# Patient Record
Sex: Male | Born: 1950 | Race: White | Hispanic: No | Marital: Married | Smoking: Current every day smoker
Health system: Southern US, Community
[De-identification: ages and names within clinical notes are randomized; demographics above are authoritative.]

## PROBLEM LIST (undated history)

## (undated) DIAGNOSIS — K279 Peptic ulcer, site unspecified, unspecified as acute or chronic, without hemorrhage or perforation: Secondary | ICD-10-CM

## (undated) DIAGNOSIS — G4730 Sleep apnea, unspecified: Secondary | ICD-10-CM

## (undated) DIAGNOSIS — J449 Chronic obstructive pulmonary disease, unspecified: Secondary | ICD-10-CM

## (undated) DIAGNOSIS — K219 Gastro-esophageal reflux disease without esophagitis: Secondary | ICD-10-CM

## (undated) DIAGNOSIS — N400 Enlarged prostate without lower urinary tract symptoms: Secondary | ICD-10-CM

## (undated) DIAGNOSIS — F419 Anxiety disorder, unspecified: Secondary | ICD-10-CM

## (undated) DIAGNOSIS — C801 Malignant (primary) neoplasm, unspecified: Secondary | ICD-10-CM

## (undated) DIAGNOSIS — E119 Type 2 diabetes mellitus without complications: Secondary | ICD-10-CM

## (undated) DIAGNOSIS — N189 Chronic kidney disease, unspecified: Secondary | ICD-10-CM

## (undated) DIAGNOSIS — K759 Inflammatory liver disease, unspecified: Secondary | ICD-10-CM

## (undated) DIAGNOSIS — M1990 Unspecified osteoarthritis, unspecified site: Secondary | ICD-10-CM

## (undated) DIAGNOSIS — I10 Essential (primary) hypertension: Secondary | ICD-10-CM

## (undated) HISTORY — PX: EYE SURGERY: SHX253

## (undated) HISTORY — PX: APPENDECTOMY: SHX54

## (undated) HISTORY — PX: ANTERIOR CERVICAL DECOMP/DISCECTOMY FUSION: SHX1161

## (undated) HISTORY — PX: CHOLECYSTECTOMY: SHX55

## (undated) HISTORY — PX: BACK SURGERY: SHX140

## (undated) HISTORY — PX: TONSILLECTOMY: SUR1361

---

## 2014-01-17 ENCOUNTER — Telehealth (HOSPITAL_COMMUNITY): Payer: Self-pay | Admitting: *Deleted

## 2014-01-24 ENCOUNTER — Telehealth (HOSPITAL_COMMUNITY): Payer: Self-pay | Admitting: *Deleted

## 2014-02-02 ENCOUNTER — Ambulatory Visit: Payer: Self-pay | Admitting: Cardiovascular Disease

## 2014-02-28 ENCOUNTER — Ambulatory Visit: Payer: Self-pay | Admitting: Cardiovascular Disease

## 2015-11-19 DIAGNOSIS — G4733 Obstructive sleep apnea (adult) (pediatric): Secondary | ICD-10-CM | POA: Diagnosis not present

## 2015-11-19 DIAGNOSIS — J449 Chronic obstructive pulmonary disease, unspecified: Secondary | ICD-10-CM | POA: Diagnosis not present

## 2015-11-19 DIAGNOSIS — E1122 Type 2 diabetes mellitus with diabetic chronic kidney disease: Secondary | ICD-10-CM | POA: Diagnosis not present

## 2015-11-19 DIAGNOSIS — M170 Bilateral primary osteoarthritis of knee: Secondary | ICD-10-CM | POA: Diagnosis not present

## 2015-11-19 DIAGNOSIS — E785 Hyperlipidemia, unspecified: Secondary | ICD-10-CM | POA: Diagnosis not present

## 2015-11-19 DIAGNOSIS — K219 Gastro-esophageal reflux disease without esophagitis: Secondary | ICD-10-CM | POA: Diagnosis not present

## 2015-11-19 DIAGNOSIS — F330 Major depressive disorder, recurrent, mild: Secondary | ICD-10-CM | POA: Diagnosis not present

## 2015-11-19 DIAGNOSIS — F17200 Nicotine dependence, unspecified, uncomplicated: Secondary | ICD-10-CM | POA: Diagnosis not present

## 2015-11-19 DIAGNOSIS — I129 Hypertensive chronic kidney disease with stage 1 through stage 4 chronic kidney disease, or unspecified chronic kidney disease: Secondary | ICD-10-CM | POA: Diagnosis not present

## 2018-01-08 DIAGNOSIS — S82401A Unspecified fracture of shaft of right fibula, initial encounter for closed fracture: Secondary | ICD-10-CM | POA: Diagnosis not present

## 2018-01-26 DIAGNOSIS — M19049 Primary osteoarthritis, unspecified hand: Secondary | ICD-10-CM | POA: Diagnosis not present

## 2018-01-26 DIAGNOSIS — M479 Spondylosis, unspecified: Secondary | ICD-10-CM | POA: Diagnosis not present

## 2018-01-26 DIAGNOSIS — K219 Gastro-esophageal reflux disease without esophagitis: Secondary | ICD-10-CM | POA: Diagnosis not present

## 2018-01-26 DIAGNOSIS — E785 Hyperlipidemia, unspecified: Secondary | ICD-10-CM | POA: Diagnosis not present

## 2018-01-26 DIAGNOSIS — F3340 Major depressive disorder, recurrent, in remission, unspecified: Secondary | ICD-10-CM | POA: Diagnosis not present

## 2018-01-26 DIAGNOSIS — E663 Overweight: Secondary | ICD-10-CM | POA: Diagnosis not present

## 2018-01-26 DIAGNOSIS — E119 Type 2 diabetes mellitus without complications: Secondary | ICD-10-CM | POA: Diagnosis not present

## 2018-01-26 DIAGNOSIS — I10 Essential (primary) hypertension: Secondary | ICD-10-CM | POA: Diagnosis not present

## 2018-01-26 DIAGNOSIS — F17200 Nicotine dependence, unspecified, uncomplicated: Secondary | ICD-10-CM | POA: Diagnosis not present

## 2019-08-12 ENCOUNTER — Other Ambulatory Visit (HOSPITAL_COMMUNITY): Payer: Self-pay | Admitting: Neurology

## 2019-08-12 DIAGNOSIS — R251 Tremor, unspecified: Secondary | ICD-10-CM

## 2019-08-12 DIAGNOSIS — G20 Parkinson's disease: Secondary | ICD-10-CM

## 2019-09-01 ENCOUNTER — Encounter (HOSPITAL_COMMUNITY)
Admission: RE | Admit: 2019-09-01 | Discharge: 2019-09-01 | Disposition: A | Discharge status: Home / Self Care | Payer: No Typology Code available for payment source | Source: Ambulatory Visit | Attending: Neurology | Admitting: Neurology

## 2019-09-01 ENCOUNTER — Ambulatory Visit (HOSPITAL_COMMUNITY)
Admission: RE | Admit: 2019-09-01 | Discharge: 2019-09-01 | Disposition: A | Discharge status: Home / Self Care | Payer: No Typology Code available for payment source | Source: Ambulatory Visit | Attending: Neurology | Admitting: Neurology

## 2019-09-01 ENCOUNTER — Other Ambulatory Visit: Payer: Self-pay

## 2019-09-01 DIAGNOSIS — G20 Parkinson's disease: Secondary | ICD-10-CM | POA: Insufficient documentation

## 2019-09-01 DIAGNOSIS — R251 Tremor, unspecified: Secondary | ICD-10-CM | POA: Diagnosis present

## 2019-09-01 MED ORDER — IODINE STRONG (LUGOLS) 5 % PO SOLN
0.8000 mL | Freq: Once | ORAL | Status: AC
  Administered 2019-09-01: 0.8 mL via ORAL

## 2019-09-01 MED ORDER — IODINE STRONG (LUGOLS) 5 % PO SOLN
ORAL | Status: AC
  Filled 2019-09-01: qty 1

## 2019-09-01 MED ORDER — IOFLUPANE I 123 185 MBQ/2.5ML IV SOLN
4.7000 | Freq: Once | INTRAVENOUS | Status: AC | PRN
  Administered 2019-09-01: 4.7 via INTRAVENOUS
  Filled 2019-09-01: qty 5

## 2020-08-29 DIAGNOSIS — J449 Chronic obstructive pulmonary disease, unspecified: Secondary | ICD-10-CM

## 2021-01-21 ENCOUNTER — Other Ambulatory Visit: Payer: Self-pay | Admitting: Neurosurgery

## 2021-01-22 ENCOUNTER — Other Ambulatory Visit: Payer: Self-pay | Admitting: Neurosurgery

## 2021-01-28 NOTE — Progress Notes (Signed)
Surgical Instructions    Your procedure is scheduled on Friday, September 23rd, 2022.   Report to [facility] Main Entrance "A" at 09:45 A.M., then check in with the Admitting office.  Call this number if you have problems the morning of surgery:  [phone]   If you have any questions prior to your surgery date call [phone]: Open Monday-Friday 8am-4pm    Remember:  Do not eat or drink after midnight the night before your surgery     Take these medicines the morning of surgery with A SIP OF WATER:  busPIRone (BUSPAR)  cetirizine (ZYRTEC) DULoxetine (CYMBALTA)  Oxcarbazepine (TRILEPTAL)  pantoprazole (PROTONIX) pravastatin (PRAVACHOL) ranolazine (RANEXA)   If needed:  albuterol (VENTOLIN HFA) - please, bring the inhaler with you the day of surgery cyclobenzaprine (FLEXERIL)   Follow your surgeon's instructions on when to stop Aspirin.  If no instructions were given by your surgeon then you will need to call the office to get those instructions.     As of today, STOP taking any Aspirin (unless otherwise instructed by your surgeon) Aleve, Naproxen, Ibuprofen, Motrin, Advil, Goody's, BC's, all herbal medications, fish oil, and all vitamins.   WHAT DO I DO ABOUT MY DIABETES MEDICATION?  THE NIGHT BEFORE SURGERY do not take insulin regular (NOVOLIN R) at bedtime      THE MORNING OF SURGERY, do not take insulin regular (NOVOLIN R)  If your CBG is greater than 220 mg/dL, you may take  of your sliding scale (correction) dose of insulin.   HOW TO MANAGE YOUR DIABETES BEFORE AND AFTER SURGERY  Why is it important to control my blood sugar before and after surgery? Improving blood sugar levels before and after surgery helps healing and can limit problems. A way of improving blood sugar control is eating a healthy diet by:  Eating less sugar and carbohydrates  Increasing activity/exercise  Talking with your doctor about reaching your blood sugar goals High blood  sugars (greater than 180 mg/dL) can raise your risk of infections and slow your recovery, so you will need to focus on controlling your diabetes during the weeks before surgery. Make sure that the doctor who takes care of your diabetes knows about your planned surgery including the date and location.  How do I manage my blood sugar before surgery? Check your blood sugar at least 4 times a day, starting 2 days before surgery, to make sure that the level is not too high or low.  Check your blood sugar the morning of your surgery when you wake up and every 2 hours until you get to the Short Stay unit.  If your blood sugar is less than 70 mg/dL, you will need to treat for low blood sugar: Do not take insulin. Treat a low blood sugar (less than 70 mg/dL) with  cup of clear juice (cranberry or apple), 4 glucose tablets, OR glucose gel. Recheck blood sugar in 15 minutes after treatment (to make sure it is greater than 70 mg/dL). If your blood sugar is not greater than 70 mg/dL on recheck, call [phone] for further instructions. Report your blood sugar to the short stay nurse when you get to Short Stay.  If you are admitted to the hospital after surgery: Your blood sugar will be checked by the staff and you will probably be given insulin after surgery (instead of oral diabetes medicines) to make sure you have good blood sugar levels. The goal for blood sugar control after surgery is 80-180 mg/dL.  Do not wear jewelry  Do not wear lotions, powders, colognes, or deodorant. Men may shave face and neck. Do not bring valuables to the hospital.              [facility] is not responsible for any belongings or valuables.  Do NOT Smoke (Tobacco/Vaping)  24 hours prior to your procedure If you use a CPAP at night, you may bring your mask for your overnight stay.   Contacts, glasses, dentures or bridgework may not be worn into surgery, please bring cases for these belongings   For  patients admitted to the hospital, discharge time will be determined by your treatment team.   Patients discharged the day of surgery will not be allowed to drive home, and someone needs to stay with them for 24 hours.  NO VISITORS WILL BE ALLOWED IN PRE-OP WHERE PATIENTS GET READY FOR SURGERY.  ONLY 1 SUPPORT PERSON MAY BE PRESENT WHILE YOU ARE IN SURGERY.  IF YOU ARE TO BE ADMITTED, ONCE YOU ARE IN YOUR ROOM YOU WILL BE ALLOWED TWO (2) VISITORS.  Minor children may have two parents present. Special consideration for safety and communication needs will be reviewed on a case by case basis.  Special instructions:    Oral Hygiene is also important to reduce your risk of infection.  Remember - BRUSH YOUR TEETH THE MORNING OF SURGERY WITH YOUR REGULAR TOOTHPASTE   [place]- Preparing For Surgery  Before surgery, you can play an important role. Because skin is not sterile, your skin needs to be as free of germs as possible. You can reduce the number of germs on your skin by washing with CHG (chlorahexidine gluconate) Soap before surgery.  CHG is an antiseptic cleaner which kills germs and bonds with the skin to continue killing germs even after washing.     Please do not use if you have an allergy to CHG or antibacterial soaps. If your skin becomes reddened/irritated stop using the CHG.  Do not shave (including legs and underarms) for at least 48 hours prior to first CHG shower. It is OK to shave your face.  Please follow these instructions carefully.     Shower the NIGHT BEFORE SURGERY and the MORNING OF SURGERY with CHG Soap.   If you chose to wash your hair, wash your hair first as usual with your normal shampoo. After you shampoo, rinse your hair and body thoroughly to remove the shampoo.  Then [employer] and genitals (private parts) with your normal soap and rinse thoroughly to remove soap.  After that Use CHG Soap as you would any other liquid soap. You can apply CHG directly to the skin  and wash gently with a scrungie or a clean washcloth.   Apply the CHG Soap to your body ONLY FROM THE NECK DOWN.  Do not use on open wounds or open sores. Avoid contact with your eyes, ears, mouth and genitals (private parts). Wash Face and genitals (private parts)  with your normal soap.   Wash thoroughly, paying special attention to the area where your surgery will be performed.  Thoroughly rinse your body with warm water from the neck down.  DO NOT shower/wash with your normal soap after using and rinsing off the CHG Soap.  Pat yourself dry with a CLEAN TOWEL.  Wear CLEAN PAJAMAS to bed the night before surgery  Place CLEAN SHEETS on your bed the night before your surgery  DO NOT SLEEP WITH PETS.   Day of  Surgery:  Take a shower with CHG soap. Wear Clean/Comfortable clothing the morning of surgery Do not apply any deodorants/lotions.   Remember to brush your teeth WITH YOUR REGULAR TOOTHPASTE.   Please read over the following fact sheets that you were given.

## 2021-01-29 ENCOUNTER — Encounter (HOSPITAL_COMMUNITY)
Admission: RE | Admit: 2021-01-29 | Discharge: 2021-01-29 | Disposition: A | Discharge status: Home / Self Care | Payer: No Typology Code available for payment source | Source: Ambulatory Visit | Attending: Neurosurgery | Admitting: Neurosurgery

## 2021-01-29 ENCOUNTER — Other Ambulatory Visit: Payer: Self-pay

## 2021-01-29 ENCOUNTER — Encounter (HOSPITAL_COMMUNITY): Payer: Self-pay

## 2021-01-29 DIAGNOSIS — Z01812 Encounter for preprocedural laboratory examination: Secondary | ICD-10-CM | POA: Insufficient documentation

## 2021-01-29 DIAGNOSIS — Z20822 Contact with and (suspected) exposure to covid-19: Secondary | ICD-10-CM | POA: Insufficient documentation

## 2021-01-29 HISTORY — DX: Peptic ulcer, site unspecified, unspecified as acute or chronic, without hemorrhage or perforation: K27.9

## 2021-01-29 HISTORY — DX: Essential (primary) hypertension: I10

## 2021-01-29 HISTORY — DX: Chronic obstructive pulmonary disease, unspecified: J44.9

## 2021-01-29 HISTORY — DX: Inflammatory liver disease, unspecified: K75.9

## 2021-01-29 HISTORY — DX: Chronic kidney disease, unspecified: N18.9

## 2021-01-29 HISTORY — DX: Anxiety disorder, unspecified: F41.9

## 2021-01-29 HISTORY — DX: Unspecified osteoarthritis, unspecified site: M19.90

## 2021-01-29 HISTORY — DX: Type 2 diabetes mellitus without complications: E11.9

## 2021-01-29 HISTORY — DX: Gastro-esophageal reflux disease without esophagitis: K21.9

## 2021-01-29 HISTORY — DX: Sleep apnea, unspecified: G47.30

## 2021-01-29 HISTORY — DX: Benign prostatic hyperplasia without lower urinary tract symptoms: N40.0

## 2021-01-29 HISTORY — DX: Malignant (primary) neoplasm, unspecified: C80.1

## 2021-01-29 LAB — COMPREHENSIVE METABOLIC PANEL
ALT: 10 U/L (ref 0–44)
AST: 14 U/L — ABNORMAL LOW (ref 15–41)
Albumin: 3.4 g/dL — ABNORMAL LOW (ref 3.5–5.0)
Alkaline Phosphatase: 111 U/L (ref 38–126)
Anion gap: 11 (ref 5–15)
BUN: 18 mg/dL (ref 8–23)
CO2: 25 mmol/L (ref 22–32)
Calcium: 9.3 mg/dL (ref 8.9–10.3)
Chloride: 95 mmol/L — ABNORMAL LOW (ref 98–111)
Creatinine, Ser: 1.75 mg/dL — ABNORMAL HIGH (ref 0.61–1.24)
GFR, Estimated: 41 mL/min — ABNORMAL LOW (ref >60)
Glucose, Bld: 154 mg/dL — ABNORMAL HIGH (ref 70–99)
Potassium: 4.4 mmol/L (ref 3.5–5.1)
Sodium: 131 mmol/L — ABNORMAL LOW (ref 135–145)
Total Bilirubin: 0.5 mg/dL (ref 0.3–1.2)
Total Protein: 6.5 g/dL (ref 6.5–8.1)

## 2021-01-29 LAB — CBC
HCT: 33.8 % — ABNORMAL LOW (ref 39.0–52.0)
Hemoglobin: 11 g/dL — ABNORMAL LOW (ref 13.0–17.0)
MCH: 29.1 pg (ref 26.0–34.0)
MCHC: 32.5 g/dL (ref 30.0–36.0)
MCV: 89.4 fL (ref 80.0–100.0)
Platelets: 301 10*3/uL (ref 150–400)
RBC: 3.78 MIL/uL — ABNORMAL LOW (ref 4.22–5.81)
RDW: 13.5 % (ref 11.5–15.5)
WBC: 8.3 10*3/uL (ref 4.0–10.5)
nRBC: 0 % (ref 0.0–0.2)

## 2021-01-29 LAB — SURGICAL PCR SCREEN
MRSA, PCR: NEGATIVE
Staphylococcus aureus: NEGATIVE

## 2021-01-29 LAB — TYPE AND SCREEN
ABO/RH(D): O POS
Antibody Screen: NEGATIVE

## 2021-01-29 LAB — GLUCOSE, CAPILLARY: Glucose-Capillary: 175 mg/dL — ABNORMAL HIGH (ref 70–99)

## 2021-01-29 LAB — SARS CORONAVIRUS 2 (TAT 6-24 HRS): SARS Coronavirus 2: NEGATIVE

## 2021-01-29 NOTE — Progress Notes (Signed)
Abnormal lab in PAT - Creatinine 1.75. Dr Kathyrn Sheriff office was notified (scheduler).

## 2021-01-29 NOTE — Progress Notes (Signed)
PCP - Sharmaine Base, MD ([place]) Cardiologist -   PPM/ICD - denies Device Orders - n/a Rep Notified - n/a  Chest x-ray - n/a EKG - 01/07/2021 ([facility]) - records requested Stress Test - > 10 years ago ECHO - 2019 per patient  Cardiac Cath - > 10 years ago  Sleep Study - yes CPAP - not wearing  Fasting Blood Sugar - 120 -215 Checks Blood Sugar 2 times a day CBG today - 175 A1C - 5.9 on 12/20/2020 ([facility])  Blood Thinner Instructions: n/a  Aspirin Instructions: Aspirin last dose - 02/03/2021 per patient  Patient was instructed: As of today, STOP taking any Aspirin (unless otherwise instructed by your surgeon) Aleve, Naproxen, Ibuprofen, Motrin, Advil, Goody's, BC's, all herbal medications, fish oil, and all vitamins.    ERAS Protcol - no  COVID TEST- 01/29/2021 in PAT  Anesthesia review: yes - records requested  Patient denies shortness of breath, fever, cough and chest pain at PAT appointment   All instructions explained to the patient, with a verbal understanding of the material. Patient agrees to go over the instructions while at home for a better understanding. Patient also instructed to self quarantine after being tested for COVID-19. The opportunity to ask questions was provided.

## 2021-01-30 NOTE — Progress Notes (Addendum)
Anesthesia Chart Review:  Case: [id number] Date/Time: 02/01/21 1128   Procedure: ACDF C34, C45   Anesthesia type: General   Pre-op diagnosis: CERVICAL DISC DISORDER WITH MYELOPATHY, CERVICAL REGION   Location: [facility] OR ROOM 7 / [facility]   Surgeons: Consuella Lose, MD       DISCUSSION: Patient is a 70 year old male scheduled for the above procedure.  History includes smoking, COPD, HTN, DM2, CKD (Stage 3), OSA (noncompliant with CPAP), fatty liver disease, melanoma (back), anxiety, back surgery, cholecystectomy, appendectomy.   [facility] cardiologist signed a letter of preoperative clearance classifying patient as "Low Risk" with permission to hold ASA for 7 days prior to surgery. It appears signature is from Dr. Gardiner Fanti.   Most [facility] record are currently failing to download through through [facility]. I was able to access some recent lab results from the [facility] that show Cr ~ 1.8-2.1 in July-August 2022. [place] records indicate history of CKD stage 3 and AS by 2007 US--however, no AS seen on 02/2020 echo.   Record request with signed ROI faxed to [facility]. I also left a voice message with [facility] cardiology nurse line (where cardiology clearance note was faxed from) requesting additional records includes last office notes, labs, EKG, and relevant cardiac studies. (He thought last stress test and cardiac cath were > 10 years ago).   Chart will be left for follow-up.  ADDENDUM 01/31/21 11:345 AM: Additional cardiology records received reviewed. Primary cardiologist is Dr. Clementeen Graham at the [facility]. He referred patient to [facility] in late summer 2021 for consider of LHC because patient had reported chest pains and refused a stress test given "bad experience with prior one" (~ 20 years ago). He was seen by cardiologist Mathis Bud, MD on 12/22/19 (see [facility]). Dr. Beatrix Fetters felt patient's symptoms were atypical for cardiac etiology  but noted patient did have multiple risk factors including smoking, DM2, hypercholesterolemia, HTN, and family history (grandmother). Dr. Beatrix Fetters did not feel comfortable having patient proceed with LHC given atypical symptoms and concern for causing renal failure given baseline CKD (Cr then ~ 2.2). He could not convince Mr. Carrero to reconsider a stress test even though he discussed improvements in medications used over the past twenty years. He added, "I did ask him the question since the likelihood is potentially high that he has multivessel coronary disease what if he needed surgery and he stated he would never consider any type of heart surgery.  It appears that one of the patient's main problems is his noncompliance." At that time BP would not allow addition of a b-blocker, but he was started on Imdur 30 mg daily and Nitro SL as needed. Since then, Mr Fauth has had ongoing follow-up with Dr. Clementeen Graham. Per 01/30/21 note, patient could not tolerate Imdur due to headaches and only took it for four days. He was started on Ranexa which was increased 02/2020. Echo then showed normal LVEF with normal wall motion. At 01/30/21 visit, patient reported a "constant swimmy headed feeling but no chest pains." Chronic DOE class III was felt stable on inhalers. No syncope, palpitations, edema, or orthopnea. He notes additional history C6-7 ACDF 07/30/16, PUD (s/p APC 08/2019), BPH, GERD, SBO (s/p exploratory laparotomy), and question of carotid artery occlusion (2007). For assessment/plan he indicates chest pain/dyspnea/weakness have be present for 5+ years with suspicion that he likely has underlying CAD. Continue Ranexa 500 mg BID, metoprolol 12.5 mg daily, ASA 81 mg daily.  EKG was normal, so did not recommend a ZioPatch for "swimmyheaded". Start exercise program recommended with six month follow-up.    Reviewed cardiology notes and preoperative cardiac clearance with anesthesiologist Renold Don, MD. Patient in on Ranexa and low  dose b-blocker for likely underlying CAD, although refuses stress test and not currently felt a candidate for LHC given stable symptoms on medical therapy and underlying CKD. There is concern that he has myelopathic symptoms for his cervical spine disease. Recent EKG showed ST at 109 bpm. Echo 02/2020 showed normal LVEF and wall motion. DM is well controlled. Still smoking. Anesthesia team to evaluate on the day of surgery.    VS: BP (!) 144/90   Pulse 95   Temp 36.7 C (Oral)   Resp 19   Ht 5\' 10"  (1.778 m)   Wt 73.9 kg   SpO2 98%   BMI 23.39 kg/m    PROVIDERS: Sharmaine Base, MD is PCP  ([place]) Gardiner Fanti, MD is cardiologist [facility]   LABS: Labs reviewed: Acceptable for surgery. Creatinine 1.75, but known CKD stage 3. His Creatinine was 2.0 - 2.1 on 01/01/21 and 1.84 on 12/06/20.  (all labs ordered are listed, but only abnormal results are displayed)  Labs Reviewed  GLUCOSE, CAPILLARY - Abnormal; Notable for the following components:      Result Value   Glucose-Capillary 175 (*)    All other components within normal limits  CBC - Abnormal; Notable for the following components:   RBC 3.78 (*)    Hemoglobin 11.0 (*)    HCT 33.8 (*)    All other components within normal limits  COMPREHENSIVE METABOLIC PANEL - Abnormal; Notable for the following components:   Sodium 131 (*)    Chloride 95 (*)    Glucose, Bld 154 (*)    Creatinine, Ser 1.75 (*)    Albumin 3.4 (*)    AST 14 (*)    GFR, Estimated 41 (*)    All other components within normal limits  SURGICAL PCR SCREEN  SARS CORONAVIRUS 2 (TAT 6-24 HRS)  TYPE AND SCREEN   A1c 12/20/20 was 5.9% ([place])   IMAGES: MRI C-spine 01/01/21 ([facility]): IMPRESSION: 1. Stable to slight progression of cervical disc and facet degeneration, worst at C4-5 where there is moderate to severe spinal stenosis and severe bilateral neural foraminal stenosis. Mildly increased cord signal at this  level may reflect edema or myelomalacia. 2. Moderate spinal stenosis at C3-4.  CT abd/pelvis 12/14/20 ([facility]): LOWER CHEST:  - Chest wall: Portions imaged are unremarkable  - Mediastinum/hila: Portions imaged are unremarkable  - Cardiovascular: Trace pericardial effusion/thickening  - Pleura: No pleural effusion  - Lungs: Emphysematous changes of the lung. Right subpleural  pulmonary opacities are better assessed and unchanged since more  recent dedicated chest CT scan of 10/25/2020. No large  consolidation.   ABDOMEN:  - The liver is unremarkable. Cholecystectomy No definitive biliary ductal dilatation. The spleen is normal. The pancreas is unremarkable. Similar nodularity of bilateral adrenal glands.  - No hydronephrosis. No nephrolithiasis. Numerous bilateral  low-attenuation lesions are difficult to confidently  characterized but overall unchanged in size and appearance when  compared to prior nonenhanced examination of 2021. They are  probably most consistent with parapelvic renal sinus and  intraparenchymal cysts. No ureteral calculus. Nonspecific  perinephric stranding.  - No evidence of bowel obstruction. The appendix is no confidently  visualized. Moderate colonic stool burden. Colonic  diverticulosis. No large submucosal  lesion is identified by CT.  Surgical clips are noted along the gastric fundus  - Vascular: Atherosclerotic calcifications involving the abdominal  aorta and its branches.   PELVIS:  - Circumferential bladder wall thickening is nonspecific but  probably related to chronic outlet obstruction and/or cystitis.  Right bladder diverticulum. No pelvic lymphadenopathy. No  retroperitoneal lymphadenopathy.  Prostatomegaly.   MSK:  - Stable sclerotic lesion within the right ischium. Multilevel  degenerative changes of the thoracolumbar spine visualized.  Postsurgical changes of ventral abdominal wall hernia repair.  Impression: 1. No large submucosal  gastric lesion is identified by CT.  2. Circumferential bladder wall thickening is nonspecific but  probably related to chronic outlet obstruction and/or cystitis.  Right bladder diverticulum. Prostatomegaly. Correlate with  patient clinical symptoms and labs.  3. Additional incidental findings as above    [facility] Sinus tachycardia at 109 bpm, otherwise normal   CV: Echo 02/13/20 ([facility]): SUMMARY  The left ventricular size is normal.  Mild left ventricular hypertrophy  Left ventricular systolic function is normal.  LV ejection fraction = 60-65%.  The left ventricular wall motion is normal.  Normal RV size and function.  Normal LA/RA size. AV sclerosis. No aortic stenosis or regurgitation.   Past Medical History:  Diagnosis Date   Anxiety    Arthritis    Cancer ([place])    melanoma on back   COPD (chronic obstructive pulmonary disease) (HCC)    Diabetes mellitus without complication ([place])    Hepatitis    fatty liver   Hypertension    Sleep apnea     Past Surgical History:  Procedure Laterality Date   APPENDECTOMY     BACK SURGERY     CHOLECYSTECTOMY     EYE SURGERY     bilateral cataracts   TONSILLECTOMY      MEDICATIONS:  albuterol (VENTOLIN HFA) 108 (90 Base) MCG/ACT inhaler   aspirin EC 81 MG tablet   busPIRone (BUSPAR) 15 MG tablet   Calcium Carb-Cholecalciferol 600-400 MG-UNIT TABS   cetirizine (ZYRTEC) 10 MG tablet   Cholecalciferol 25 MCG (1000 UT) tablet   cyclobenzaprine (FLEXERIL) 10 MG tablet   DULoxetine (CYMBALTA) 60 MG capsule   famotidine (PEPCID) 20 MG tablet   insulin regular (NOVOLIN R) 100 units/mL injection   lidocaine (LIDODERM) 5 %   linaclotide (LINZESS) 145 MCG CAPS capsule   Magnesium Oxide 420 MG TABS   metoprolol tartrate (LOPRESSOR) 25 MG tablet   Omega-3 Fatty Acids (FISH OIL) 500 MG CAPS   Oxcarbazepine (TRILEPTAL) 300 MG tablet   pantoprazole (PROTONIX) 40 MG tablet   pravastatin (PRAVACHOL) 10 MG tablet    ranolazine (RANEXA) 500 MG 12 hr tablet   rOPINIRole (REQUIP) 0.25 MG tablet   tamsulosin (FLOMAX) 0.4 MG CAPS capsule   No current facility-administered medications for this encounter.    Myra Gianotti, PA-C Surgical Short Stay/Anesthesiology [facility] Phone [phone] [facility] Phone [phone] 01/30/2021 5:00 PM

## 2021-01-31 ENCOUNTER — Encounter (HOSPITAL_COMMUNITY): Payer: Self-pay

## 2021-01-31 NOTE — Anesthesia Preprocedure Evaluation (Addendum)
Anesthesia Evaluation  Patient identified by MRN, date of birth, ID band Patient awake    Reviewed: Allergy & Precautions, Patient's Chart, lab work & pertinent test results  History of Anesthesia Complications Negative for: history of anesthetic complications  Airway Mallampati: I  TM Distance: >3 FB Neck ROM: Full    Dental no notable dental hx. (+) Dental Advisory Given   Pulmonary COPD,  COPD inhaler, Current Smoker,    Pulmonary exam normal        Cardiovascular hypertension, Pt. on home beta blockers  Rhythm:Regular Rate:Tachycardia  Echo 02/13/20 ([facility]): SUMMARY  The left ventricular size is normal.  Mild left ventricular hypertrophy  Left ventricular systolic function is normal.  LV ejection fraction = 60-65%.  The left ventricular wall motion is normal.  Normal RV size and function.  Normal LA/RA size. AV sclerosis. No aortic stenosis or regurgitation   Neuro/Psych negative neurological ROS     GI/Hepatic Neg liver ROS, PUD, GERD  ,  Endo/Other  negative endocrine ROSdiabetes  Renal/GU Renal InsufficiencyRenal disease     Musculoskeletal negative musculoskeletal ROS (+)   Abdominal   Peds  Hematology negative hematology ROS (+)   Anesthesia Other Findings   Reproductive/Obstetrics                           Anesthesia Physical Anesthesia Plan  ASA: 3  Anesthesia Plan: General   Post-op Pain Management:    Induction: Intravenous  PONV Risk Score and Plan: 2 and Ondansetron and Dexamethasone  Airway Management Planned: Oral ETT  Additional Equipment:   Intra-op Plan:   Post-operative Plan: Extubation in OR  Informed Consent: I have reviewed the patients History and Physical, chart, labs and discussed the procedure including the risks, benefits and alternatives for the proposed anesthesia with the patient or authorized representative who has indicated his/her  understanding and acceptance.     Dental advisory given  Plan Discussed with: Anesthesiologist, CRNA and Surgeon  Anesthesia Plan Comments: (See PAT note written by Myra Gianotti, PA-C. )      Anesthesia Quick Evaluation

## 2021-02-01 ENCOUNTER — Ambulatory Visit (HOSPITAL_COMMUNITY): Payer: No Typology Code available for payment source

## 2021-02-01 ENCOUNTER — Encounter (HOSPITAL_COMMUNITY): Payer: Self-pay | Admitting: Neurosurgery

## 2021-02-01 ENCOUNTER — Inpatient Hospital Stay (HOSPITAL_COMMUNITY)
Admission: RE | Admit: 2021-02-01 | Discharge: 2021-02-09 | DRG: 472 | Disposition: A | Discharge status: Home / Self Care | Payer: No Typology Code available for payment source | Attending: Neurosurgery | Admitting: Neurosurgery

## 2021-02-01 ENCOUNTER — Other Ambulatory Visit: Payer: Self-pay

## 2021-02-01 ENCOUNTER — Encounter (HOSPITAL_COMMUNITY)
Admission: RE | Disposition: A | Discharge status: Home / Self Care | Payer: Self-pay | Source: Home / Self Care | Attending: Neurosurgery

## 2021-02-01 ENCOUNTER — Ambulatory Visit (HOSPITAL_COMMUNITY): Payer: No Typology Code available for payment source | Admitting: Anesthesiology

## 2021-02-01 ENCOUNTER — Ambulatory Visit (HOSPITAL_COMMUNITY): Payer: No Typology Code available for payment source | Admitting: Vascular Surgery

## 2021-02-01 DIAGNOSIS — F419 Anxiety disorder, unspecified: Secondary | ICD-10-CM | POA: Diagnosis present

## 2021-02-01 DIAGNOSIS — Z79899 Other long term (current) drug therapy: Secondary | ICD-10-CM

## 2021-02-01 DIAGNOSIS — G4730 Sleep apnea, unspecified: Secondary | ICD-10-CM | POA: Diagnosis present

## 2021-02-01 DIAGNOSIS — M25511 Pain in right shoulder: Secondary | ICD-10-CM

## 2021-02-01 DIAGNOSIS — E1122 Type 2 diabetes mellitus with diabetic chronic kidney disease: Secondary | ICD-10-CM | POA: Diagnosis not present

## 2021-02-01 DIAGNOSIS — Z419 Encounter for procedure for purposes other than remedying health state, unspecified: Secondary | ICD-10-CM

## 2021-02-01 DIAGNOSIS — M4802 Spinal stenosis, cervical region: Secondary | ICD-10-CM | POA: Diagnosis not present

## 2021-02-01 DIAGNOSIS — K219 Gastro-esophageal reflux disease without esophagitis: Secondary | ICD-10-CM | POA: Diagnosis present

## 2021-02-01 DIAGNOSIS — K760 Fatty (change of) liver, not elsewhere classified: Secondary | ICD-10-CM | POA: Diagnosis present

## 2021-02-01 DIAGNOSIS — Z8711 Personal history of peptic ulcer disease: Secondary | ICD-10-CM

## 2021-02-01 DIAGNOSIS — F17210 Nicotine dependence, cigarettes, uncomplicated: Secondary | ICD-10-CM | POA: Diagnosis present

## 2021-02-01 DIAGNOSIS — M1990 Unspecified osteoarthritis, unspecified site: Secondary | ICD-10-CM | POA: Diagnosis present

## 2021-02-01 DIAGNOSIS — G992 Myelopathy in diseases classified elsewhere: Secondary | ICD-10-CM | POA: Diagnosis not present

## 2021-02-01 DIAGNOSIS — Z888 Allergy status to other drugs, medicaments and biological substances status: Secondary | ICD-10-CM

## 2021-02-01 DIAGNOSIS — Z7982 Long term (current) use of aspirin: Secondary | ICD-10-CM

## 2021-02-01 DIAGNOSIS — Z85820 Personal history of malignant melanoma of skin: Secondary | ICD-10-CM

## 2021-02-01 DIAGNOSIS — I129 Hypertensive chronic kidney disease with stage 1 through stage 4 chronic kidney disease, or unspecified chronic kidney disease: Secondary | ICD-10-CM | POA: Diagnosis present

## 2021-02-01 DIAGNOSIS — N400 Enlarged prostate without lower urinary tract symptoms: Secondary | ICD-10-CM | POA: Diagnosis present

## 2021-02-01 DIAGNOSIS — N189 Chronic kidney disease, unspecified: Secondary | ICD-10-CM | POA: Diagnosis present

## 2021-02-01 DIAGNOSIS — Z794 Long term (current) use of insulin: Secondary | ICD-10-CM

## 2021-02-01 DIAGNOSIS — J449 Chronic obstructive pulmonary disease, unspecified: Secondary | ICD-10-CM | POA: Diagnosis present

## 2021-02-01 DIAGNOSIS — Z9181 History of falling: Secondary | ICD-10-CM

## 2021-02-01 HISTORY — PX: ANTERIOR CERVICAL DECOMP/DISCECTOMY FUSION: SHX1161

## 2021-02-01 LAB — GLUCOSE, CAPILLARY
Glucose-Capillary: 155 mg/dL — ABNORMAL HIGH (ref 70–99)
Glucose-Capillary: 161 mg/dL — ABNORMAL HIGH (ref 70–99)
Glucose-Capillary: 206 mg/dL — ABNORMAL HIGH (ref 70–99)

## 2021-02-01 LAB — ABO/RH: ABO/RH(D): O POS

## 2021-02-01 SURGERY — ANTERIOR CERVICAL DECOMPRESSION/DISCECTOMY FUSION 2 LEVELS
Anesthesia: General

## 2021-02-01 MED ORDER — POLYETHYLENE GLYCOL 3350 17 G PO PACK: 17.0000 g | PACK | Freq: Every day | ORAL | Status: DC | PRN

## 2021-02-01 MED ORDER — LIDOCAINE HCL 1 % IJ SOLN
INTRAMUSCULAR | Status: AC
  Filled 2021-02-01: qty 20

## 2021-02-01 MED ORDER — 0.9 % SODIUM CHLORIDE (POUR BTL) OPTIME
TOPICAL | Status: DC | PRN
  Administered 2021-02-01: 1000 mL

## 2021-02-01 MED ORDER — SENNA 8.6 MG PO TABS
1.0000 | ORAL_TABLET | Freq: Two times a day (BID) | ORAL | Status: DC
  Administered 2021-02-01 – 2021-02-09 (×7): 8.6 mg via ORAL
  Filled 2021-02-01 (×13): qty 1

## 2021-02-01 MED ORDER — ONDANSETRON HCL 4 MG/2ML IJ SOLN
INTRAMUSCULAR | Status: DC | PRN
Start: 2021-02-01 — End: 2021-02-01
  Administered 2021-02-01: 4 mg via INTRAVENOUS

## 2021-02-01 MED ORDER — LACTATED RINGERS IV SOLN: INTRAVENOUS | Status: DC | PRN

## 2021-02-01 MED ORDER — RANOLAZINE ER 500 MG PO TB12
500.0000 mg | ORAL_TABLET | Freq: Two times a day (BID) | ORAL | Status: DC
  Administered 2021-02-01 – 2021-02-09 (×14): 500 mg via ORAL
  Filled 2021-02-01 (×18): qty 1

## 2021-02-01 MED ORDER — LACTATED RINGERS IV SOLN: INTRAVENOUS | Status: DC

## 2021-02-01 MED ORDER — ALBUTEROL SULFATE (2.5 MG/3ML) 0.083% IN NEBU: 3.0000 mL | INHALATION_SOLUTION | Freq: Four times a day (QID) | RESPIRATORY_TRACT | Status: DC | PRN

## 2021-02-01 MED ORDER — SODIUM CHLORIDE 0.9% FLUSH: 3.0000 mL | INTRAVENOUS | Status: DC | PRN

## 2021-02-01 MED ORDER — ORAL CARE MOUTH RINSE: 15.0000 mL | Freq: Once | OROMUCOSAL | Status: AC

## 2021-02-01 MED ORDER — PHENYLEPHRINE HCL-NACL 20-0.9 MG/250ML-% IV SOLN
INTRAVENOUS | Status: DC | PRN
  Administered 2021-02-01: 60 ug/min via INTRAVENOUS

## 2021-02-01 MED ORDER — CYCLOBENZAPRINE HCL 10 MG PO TABS: 10.0000 mg | ORAL_TABLET | Freq: Three times a day (TID) | ORAL | Status: DC | PRN

## 2021-02-01 MED ORDER — ALBUMIN HUMAN 5 % IV SOLN: INTRAVENOUS | Status: DC | PRN

## 2021-02-01 MED ORDER — OXYCODONE HCL 5 MG PO TABS
5.0000 mg | ORAL_TABLET | ORAL | Status: DC | PRN
Start: 2021-02-01 — End: 2021-02-09

## 2021-02-01 MED ORDER — PRAVASTATIN SODIUM 10 MG PO TABS
10.0000 mg | ORAL_TABLET | Freq: Every day | ORAL | Status: DC
  Administered 2021-02-02 – 2021-02-09 (×7): 10 mg via ORAL
  Filled 2021-02-01 (×8): qty 1

## 2021-02-01 MED ORDER — LORATADINE 10 MG PO TABS
10.0000 mg | ORAL_TABLET | Freq: Every day | ORAL | Status: DC
  Administered 2021-02-02 – 2021-02-09 (×3): 10 mg via ORAL
  Filled 2021-02-01 (×7): qty 1

## 2021-02-01 MED ORDER — CEFAZOLIN SODIUM-DEXTROSE 2-4 GM/100ML-% IV SOLN
INTRAVENOUS | Status: AC
  Filled 2021-02-01: qty 100

## 2021-02-01 MED ORDER — LABETALOL HCL 5 MG/ML IV SOLN: 5.0000 mg | Freq: Once | INTRAVENOUS | Status: AC

## 2021-02-01 MED ORDER — MORPHINE SULFATE (PF) 4 MG/ML IV SOLN
4.0000 mg | INTRAVENOUS | Status: DC | PRN
Start: 2021-02-01 — End: 2021-02-05
  Administered 2021-02-01 – 2021-02-05 (×11): 4 mg via INTRAVENOUS
  Filled 2021-02-01 (×11): qty 1

## 2021-02-01 MED ORDER — SUGAMMADEX SODIUM 200 MG/2ML IV SOLN
INTRAVENOUS | Status: DC | PRN
  Administered 2021-02-01: 300 mg via INTRAVENOUS

## 2021-02-01 MED ORDER — BUSPIRONE HCL 5 MG PO TABS
15.0000 mg | ORAL_TABLET | Freq: Two times a day (BID) | ORAL | Status: DC
  Administered 2021-02-01 – 2021-02-09 (×13): 15 mg via ORAL
  Filled 2021-02-01 (×5): qty 3
  Filled 2021-02-01: qty 1
  Filled 2021-02-01 (×12): qty 3

## 2021-02-01 MED ORDER — OMEGA-3-ACID ETHYL ESTERS 1 G PO CAPS
1.0000 g | ORAL_CAPSULE | Freq: Every day | ORAL | Status: DC
  Administered 2021-02-02 – 2021-02-03 (×2): 1 g via ORAL
  Filled 2021-02-01 (×7): qty 1

## 2021-02-01 MED ORDER — DEXAMETHASONE SODIUM PHOSPHATE 10 MG/ML IJ SOLN
INTRAMUSCULAR | Status: DC | PRN
  Administered 2021-02-01: 10 mg via INTRAVENOUS

## 2021-02-01 MED ORDER — LABETALOL HCL 5 MG/ML IV SOLN
INTRAVENOUS | Status: AC
  Administered 2021-02-01: 5 mg via INTRAVENOUS
  Filled 2021-02-01: qty 4

## 2021-02-01 MED ORDER — MAGNESIUM OXIDE -MG SUPPLEMENT 400 (240 MG) MG PO TABS
400.0000 mg | ORAL_TABLET | Freq: Two times a day (BID) | ORAL | Status: DC
  Administered 2021-02-01 – 2021-02-09 (×13): 400 mg via ORAL
  Filled 2021-02-01 (×16): qty 1

## 2021-02-01 MED ORDER — PHENOL 1.4 % MT LIQD
1.0000 | OROMUCOSAL | Status: DC | PRN
  Filled 2021-02-01: qty 177

## 2021-02-01 MED ORDER — METHOCARBAMOL 1000 MG/10ML IJ SOLN
500.0000 mg | Freq: Four times a day (QID) | INTRAVENOUS | Status: DC | PRN
  Filled 2021-02-01: qty 5

## 2021-02-01 MED ORDER — PHENYLEPHRINE 40 MCG/ML (10ML) SYRINGE FOR IV PUSH (FOR BLOOD PRESSURE SUPPORT)
PREFILLED_SYRINGE | INTRAVENOUS | Status: DC | PRN
  Administered 2021-02-01: 120 ug via INTRAVENOUS
  Administered 2021-02-01: 80 ug via INTRAVENOUS
  Administered 2021-02-01 (×2): 40 ug via INTRAVENOUS
  Administered 2021-02-01: 80 ug via INTRAVENOUS

## 2021-02-01 MED ORDER — FENTANYL CITRATE (PF) 250 MCG/5ML IJ SOLN
INTRAMUSCULAR | Status: AC
  Filled 2021-02-01: qty 5

## 2021-02-01 MED ORDER — PROPOFOL 10 MG/ML IV BOLUS
INTRAVENOUS | Status: DC | PRN
Start: 2021-02-01 — End: 2021-02-01
  Administered 2021-02-01: 80 mg via INTRAVENOUS

## 2021-02-01 MED ORDER — THROMBIN 5000 UNITS EX SOLR
CUTANEOUS | Status: AC
  Filled 2021-02-01: qty 5000

## 2021-02-01 MED ORDER — EPHEDRINE 5 MG/ML INJ
INTRAVENOUS | Status: AC
  Filled 2021-02-01: qty 5

## 2021-02-01 MED ORDER — DOCUSATE SODIUM 100 MG PO CAPS
100.0000 mg | ORAL_CAPSULE | Freq: Two times a day (BID) | ORAL | Status: DC
  Administered 2021-02-01 – 2021-02-09 (×7): 100 mg via ORAL
  Filled 2021-02-01 (×14): qty 1

## 2021-02-01 MED ORDER — LIDOCAINE-EPINEPHRINE 1 %-1:100000 IJ SOLN
INTRAMUSCULAR | Status: DC | PRN
  Administered 2021-02-01: 5 mL

## 2021-02-01 MED ORDER — METOPROLOL TARTRATE 12.5 MG HALF TABLET
12.5000 mg | ORAL_TABLET | Freq: Every day | ORAL | Status: DC
  Administered 2021-02-01 – 2021-02-08 (×8): 12.5 mg via ORAL
  Filled 2021-02-01 (×8): qty 1

## 2021-02-01 MED ORDER — ACETAMINOPHEN 650 MG RE SUPP: 650.0000 mg | RECTAL | Status: DC | PRN

## 2021-02-01 MED ORDER — MENTHOL 3 MG MT LOZG: 1.0000 | LOZENGE | OROMUCOSAL | Status: DC | PRN

## 2021-02-01 MED ORDER — ACETAMINOPHEN 325 MG PO TABS
650.0000 mg | ORAL_TABLET | ORAL | Status: DC | PRN
  Administered 2021-02-08 – 2021-02-09 (×2): 650 mg via ORAL
  Filled 2021-02-01 (×3): qty 2

## 2021-02-01 MED ORDER — METHOCARBAMOL 500 MG PO TABS
500.0000 mg | ORAL_TABLET | Freq: Four times a day (QID) | ORAL | Status: DC | PRN
  Administered 2021-02-01 – 2021-02-09 (×9): 500 mg via ORAL
  Filled 2021-02-01 (×11): qty 1

## 2021-02-01 MED ORDER — ROPINIROLE HCL 0.25 MG PO TABS
0.2500 mg | ORAL_TABLET | Freq: Every day | ORAL | Status: DC
  Administered 2021-02-01 – 2021-02-08 (×7): 0.25 mg via ORAL
  Filled 2021-02-01 (×10): qty 1

## 2021-02-01 MED ORDER — CEFAZOLIN SODIUM-DEXTROSE 2-4 GM/100ML-% IV SOLN
2.0000 g | INTRAVENOUS | Status: AC
  Administered 2021-02-01: 2 g via INTRAVENOUS

## 2021-02-01 MED ORDER — CALCIUM CARBONATE-VITAMIN D 500-200 MG-UNIT PO TABS
1.0000 | ORAL_TABLET | Freq: Two times a day (BID) | ORAL | Status: DC
  Administered 2021-02-01 – 2021-02-09 (×10): 1 via ORAL
  Filled 2021-02-01 (×15): qty 1

## 2021-02-01 MED ORDER — SODIUM CHLORIDE 0.9 % IV SOLN: 250.0000 mL | INTRAVENOUS | Status: DC

## 2021-02-01 MED ORDER — FENTANYL CITRATE (PF) 100 MCG/2ML IJ SOLN
25.0000 ug | INTRAMUSCULAR | Status: DC | PRN
  Administered 2021-02-01 (×4): 25 ug via INTRAVENOUS

## 2021-02-01 MED ORDER — THROMBIN 5000 UNITS EX SOLR
OROMUCOSAL | Status: DC | PRN
  Administered 2021-02-01: 5 mL via TOPICAL

## 2021-02-01 MED ORDER — ONDANSETRON HCL 4 MG PO TABS: 4.0000 mg | ORAL_TABLET | Freq: Four times a day (QID) | ORAL | Status: DC | PRN

## 2021-02-01 MED ORDER — EPHEDRINE SULFATE-NACL 50-0.9 MG/10ML-% IV SOSY
PREFILLED_SYRINGE | INTRAVENOUS | Status: DC | PRN
  Administered 2021-02-01 (×2): 5 mg via INTRAVENOUS
  Administered 2021-02-01: 10 mg via INTRAVENOUS
  Administered 2021-02-01: 5 mg via INTRAVENOUS
  Administered 2021-02-01: 10 mg via INTRAVENOUS
  Administered 2021-02-01: 15 mg via INTRAVENOUS

## 2021-02-01 MED ORDER — CHLORHEXIDINE GLUCONATE CLOTH 2 % EX PADS: 6.0000 | MEDICATED_PAD | Freq: Once | CUTANEOUS | Status: DC

## 2021-02-01 MED ORDER — FAMOTIDINE 20 MG PO TABS
20.0000 mg | ORAL_TABLET | Freq: Every day | ORAL | Status: DC
  Administered 2021-02-01 – 2021-02-08 (×7): 20 mg via ORAL
  Filled 2021-02-01 (×8): qty 1

## 2021-02-01 MED ORDER — OXCARBAZEPINE 300 MG PO TABS
300.0000 mg | ORAL_TABLET | Freq: Two times a day (BID) | ORAL | Status: DC
  Administered 2021-02-01 – 2021-02-09 (×14): 300 mg via ORAL
  Filled 2021-02-01 (×18): qty 1

## 2021-02-01 MED ORDER — FLEET ENEMA 7-19 GM/118ML RE ENEM: 1.0000 | ENEMA | Freq: Once | RECTAL | Status: DC | PRN

## 2021-02-01 MED ORDER — PROPOFOL 10 MG/ML IV BOLUS
INTRAVENOUS | Status: AC
  Filled 2021-02-01: qty 20

## 2021-02-01 MED ORDER — CHLORHEXIDINE GLUCONATE 0.12 % MT SOLN
OROMUCOSAL | Status: AC
  Administered 2021-02-01: 15 mL via OROMUCOSAL
  Filled 2021-02-01: qty 15

## 2021-02-01 MED ORDER — VITAMIN D 25 MCG (1000 UNIT) PO TABS
1000.0000 [IU] | ORAL_TABLET | Freq: Every day | ORAL | Status: DC
  Administered 2021-02-02 – 2021-02-09 (×6): 1000 [IU] via ORAL
  Filled 2021-02-01 (×8): qty 1

## 2021-02-01 MED ORDER — BUPIVACAINE HCL (PF) 0.5 % IJ SOLN
INTRAMUSCULAR | Status: AC
  Filled 2021-02-01: qty 30

## 2021-02-01 MED ORDER — FENTANYL CITRATE (PF) 250 MCG/5ML IJ SOLN
INTRAMUSCULAR | Status: DC | PRN
  Administered 2021-02-01: 100 ug via INTRAVENOUS
  Administered 2021-02-01 (×2): 50 ug via INTRAVENOUS

## 2021-02-01 MED ORDER — FENTANYL CITRATE (PF) 100 MCG/2ML IJ SOLN
INTRAMUSCULAR | Status: AC
  Filled 2021-02-01: qty 2

## 2021-02-01 MED ORDER — BISACODYL 10 MG RE SUPP: 10.0000 mg | Freq: Every day | RECTAL | Status: DC | PRN

## 2021-02-01 MED ORDER — OXYCODONE HCL 5 MG PO TABS
10.0000 mg | ORAL_TABLET | ORAL | Status: DC | PRN
  Administered 2021-02-01 – 2021-02-09 (×27): 10 mg via ORAL
  Filled 2021-02-01 (×27): qty 2

## 2021-02-01 MED ORDER — THROMBIN 20000 UNITS EX SOLR
CUTANEOUS | Status: AC
  Filled 2021-02-01: qty 20000

## 2021-02-01 MED ORDER — INSULIN ASPART 100 UNIT/ML IJ SOLN
0.0000 [IU] | Freq: Three times a day (TID) | INTRAMUSCULAR | Status: DC
  Administered 2021-02-01: 5 [IU] via SUBCUTANEOUS
  Administered 2021-02-02 (×3): 2 [IU] via SUBCUTANEOUS
  Administered 2021-02-03: 3 [IU] via SUBCUTANEOUS
  Administered 2021-02-03 – 2021-02-05 (×4): 2 [IU] via SUBCUTANEOUS
  Administered 2021-02-06 (×2): 3 [IU] via SUBCUTANEOUS
  Administered 2021-02-06: 2 [IU] via SUBCUTANEOUS
  Administered 2021-02-07: 3 [IU] via SUBCUTANEOUS
  Administered 2021-02-07 – 2021-02-08 (×3): 2 [IU] via SUBCUTANEOUS
  Administered 2021-02-09: 5 [IU] via SUBCUTANEOUS

## 2021-02-01 MED ORDER — DULOXETINE HCL 60 MG PO CPEP
60.0000 mg | ORAL_CAPSULE | Freq: Two times a day (BID) | ORAL | Status: DC
  Administered 2021-02-01 – 2021-02-09 (×14): 60 mg via ORAL
  Filled 2021-02-01 (×14): qty 1
  Filled 2021-02-01: qty 2
  Filled 2021-02-01: qty 1

## 2021-02-01 MED ORDER — ONDANSETRON HCL 4 MG/2ML IJ SOLN
4.0000 mg | Freq: Four times a day (QID) | INTRAMUSCULAR | Status: DC | PRN
  Administered 2021-02-08: 4 mg via INTRAVENOUS
  Filled 2021-02-01: qty 2

## 2021-02-01 MED ORDER — CHLORHEXIDINE GLUCONATE 0.12 % MT SOLN
15.0000 mL | Freq: Once | OROMUCOSAL | Status: AC
Start: 2021-02-01 — End: 2021-02-01

## 2021-02-01 MED ORDER — LINACLOTIDE 145 MCG PO CAPS
145.0000 ug | ORAL_CAPSULE | Freq: Every day | ORAL | Status: DC
  Administered 2021-02-02 – 2021-02-09 (×5): 145 ug via ORAL
  Filled 2021-02-01 (×9): qty 1

## 2021-02-01 MED ORDER — SODIUM CHLORIDE 0.9 % IV SOLN: INTRAVENOUS | Status: DC

## 2021-02-01 MED ORDER — CEFAZOLIN SODIUM-DEXTROSE 2-4 GM/100ML-% IV SOLN
2.0000 g | Freq: Three times a day (TID) | INTRAVENOUS | Status: AC
  Administered 2021-02-01 – 2021-02-02 (×2): 2 g via INTRAVENOUS
  Filled 2021-02-01 (×2): qty 100

## 2021-02-01 MED ORDER — LABETALOL HCL 5 MG/ML IV SOLN
5.0000 mg | Freq: Once | INTRAVENOUS | Status: AC
  Administered 2021-02-01: 5 mg via INTRAVENOUS

## 2021-02-01 MED ORDER — LIDOCAINE 2% (20 MG/ML) 5 ML SYRINGE
INTRAMUSCULAR | Status: DC | PRN
  Administered 2021-02-01: 100 mg via INTRAVENOUS

## 2021-02-01 MED ORDER — INSULIN ASPART 100 UNIT/ML IJ SOLN: 10.0000 [IU] | Freq: Two times a day (BID) | INTRAMUSCULAR | Status: DC | PRN

## 2021-02-01 MED ORDER — PANTOPRAZOLE SODIUM 40 MG PO TBEC
40.0000 mg | DELAYED_RELEASE_TABLET | Freq: Every morning | ORAL | Status: DC
  Administered 2021-02-02 – 2021-02-09 (×7): 40 mg via ORAL
  Filled 2021-02-01 (×7): qty 1

## 2021-02-01 MED ORDER — TAMSULOSIN HCL 0.4 MG PO CAPS
0.8000 mg | ORAL_CAPSULE | Freq: Every day | ORAL | Status: DC
  Administered 2021-02-01 – 2021-02-08 (×7): 0.8 mg via ORAL
  Filled 2021-02-01 (×8): qty 2

## 2021-02-01 MED ORDER — THROMBIN 20000 UNITS EX SOLR
CUTANEOUS | Status: DC | PRN
  Administered 2021-02-01: 20 mL via TOPICAL

## 2021-02-01 MED ORDER — PANTOPRAZOLE SODIUM 40 MG IV SOLR: 40.0000 mg | Freq: Every day | INTRAVENOUS | Status: DC

## 2021-02-01 MED ORDER — BUPIVACAINE HCL 0.5 % IJ SOLN
INTRAMUSCULAR | Status: DC | PRN
  Administered 2021-02-01: 5 mL

## 2021-02-01 MED ORDER — SODIUM CHLORIDE 0.9% FLUSH
3.0000 mL | Freq: Two times a day (BID) | INTRAVENOUS | Status: DC
  Administered 2021-02-01 – 2021-02-09 (×16): 3 mL via INTRAVENOUS

## 2021-02-01 MED ORDER — ROCURONIUM BROMIDE 10 MG/ML (PF) SYRINGE
PREFILLED_SYRINGE | INTRAVENOUS | Status: DC | PRN
  Administered 2021-02-01: 10 mg via INTRAVENOUS
  Administered 2021-02-01: 70 mg via INTRAVENOUS

## 2021-02-01 SURGICAL SUPPLY — 68 items
ADH SKN CLS APL DERMABOND .7 (GAUZE/BANDAGES/DRESSINGS) ×1
APL SKNCLS STERI-STRIP NONHPOA (GAUZE/BANDAGES/DRESSINGS)
BAG COUNTER SPONGE SURGICOUNT (BAG) ×2 IMPLANT
BAG SPNG CNTER NS LX DISP (BAG) ×1
BAND INSRT 18 STRL LF DISP RB (MISCELLANEOUS) ×2
BAND RUBBER #18 3X1/16 STRL (MISCELLANEOUS) ×4 IMPLANT
BENZOIN TINCTURE PRP APPL 2/3 (GAUZE/BANDAGES/DRESSINGS) IMPLANT
BIT DRILL 13 (BIT) ×1 IMPLANT
BLADE CLIPPER SURG (BLADE) IMPLANT
BLADE SURG 11 STRL SS (BLADE) ×2 IMPLANT
BLADE ULTRA TIP 2M (BLADE) IMPLANT
BNDG GAUZE ELAST 4 BULKY (GAUZE/BANDAGES/DRESSINGS) IMPLANT
BUR MATCHSTICK NEURO 3.0 LAGG (BURR) ×2 IMPLANT
CAGE LORDOTIC 6 SM (Cage) ×1 IMPLANT
CANISTER SUCT 3000ML PPV (MISCELLANEOUS) ×2 IMPLANT
CARTRIDGE OIL MAESTRO DRILL (MISCELLANEOUS) ×1 IMPLANT
DECANTER SPIKE VIAL GLASS SM (MISCELLANEOUS) ×2 IMPLANT
DERMABOND ADVANCED (GAUZE/BANDAGES/DRESSINGS) ×1
DERMABOND ADVANCED .7 DNX12 (GAUZE/BANDAGES/DRESSINGS) ×1 IMPLANT
DEVICE ENDSKLTN IMPLANT SM 7MM (Cage) IMPLANT
DIFFUSER DRILL AIR PNEUMATIC (MISCELLANEOUS) ×2 IMPLANT
DRAIN CHANNEL 10M FLAT 3/4 FLT (DRAIN) IMPLANT
DRAPE C-ARM 42X72 X-RAY (DRAPES) ×4 IMPLANT
DRAPE HALF SHEET 40X57 (DRAPES) IMPLANT
DRAPE LAPAROTOMY 100X72 PEDS (DRAPES) ×2 IMPLANT
DRAPE MICROSCOPE LEICA (MISCELLANEOUS) ×2 IMPLANT
DRSG OPSITE 4X5.5 SM (GAUZE/BANDAGES/DRESSINGS) ×4 IMPLANT
DRSG OPSITE POSTOP 3X4 (GAUZE/BANDAGES/DRESSINGS) ×4 IMPLANT
DRSG OPSITE POSTOP 4X6 (GAUZE/BANDAGES/DRESSINGS) ×1 IMPLANT
DURAPREP 6ML APPLICATOR 50/CS (WOUND CARE) ×2 IMPLANT
ELECT COATED BLADE 2.86 ST (ELECTRODE) ×2 IMPLANT
ELECT REM PT RETURN 9FT ADLT (ELECTROSURGICAL) ×2
ELECTRODE REM PT RTRN 9FT ADLT (ELECTROSURGICAL) ×1 IMPLANT
ENDOSKELETON IMPLANT SM 7MM (Cage) ×2 IMPLANT
EVACUATOR SILICONE 100CC (DRAIN) IMPLANT
GAUZE 4X4 16PLY ~~LOC~~+RFID DBL (SPONGE) IMPLANT
GLOVE EXAM NITRILE XL STR (GLOVE) IMPLANT
GLOVE SURG ENC MOIS LTX SZ7.5 (GLOVE) IMPLANT
GLOVE SURG LTX SZ7 (GLOVE) ×2 IMPLANT
GLOVE SURG UNDER POLY LF SZ7.5 (GLOVE) ×4 IMPLANT
GOWN STRL REUS W/ TWL LRG LVL3 (GOWN DISPOSABLE) ×2 IMPLANT
GOWN STRL REUS W/ TWL XL LVL3 (GOWN DISPOSABLE) IMPLANT
GOWN STRL REUS W/TWL 2XL LVL3 (GOWN DISPOSABLE) IMPLANT
GOWN STRL REUS W/TWL LRG LVL3 (GOWN DISPOSABLE) ×4
GOWN STRL REUS W/TWL XL LVL3 (GOWN DISPOSABLE)
HEMOSTAT POWDER KIT SURGIFOAM (HEMOSTASIS) ×2 IMPLANT
KIT BASIN OR (CUSTOM PROCEDURE TRAY) ×2 IMPLANT
KIT TURNOVER KIT B (KITS) ×2 IMPLANT
NDL SPNL 22GX3.5 QUINCKE BK (NEEDLE) ×1 IMPLANT
NEEDLE HYPO 22GX1.5 SAFETY (NEEDLE) ×2 IMPLANT
NEEDLE SPNL 22GX3.5 QUINCKE BK (NEEDLE) ×2 IMPLANT
NS IRRIG 1000ML POUR BTL (IV SOLUTION) ×2 IMPLANT
OIL CARTRIDGE MAESTRO DRILL (MISCELLANEOUS) ×2
PACK LAMINECTOMY NEURO (CUSTOM PROCEDURE TRAY) ×2 IMPLANT
PAD ARMBOARD 7.5X6 YLW CONV (MISCELLANEOUS) ×6 IMPLANT
PLATE ZEVO 1LVL 19MM (Plate) ×2 IMPLANT
PUTTY DBF 1CC CORTICAL FIBERS (Putty) ×2 IMPLANT
SCREW 3.5 SELFDRILL 15MM VARI (Screw) ×8 IMPLANT
SPONGE INTESTINAL PEANUT (DISPOSABLE) ×2 IMPLANT
SPONGE SURGIFOAM ABS GEL 100 (HEMOSTASIS) ×2 IMPLANT
STRIP CLOSURE SKIN 1/2X4 (GAUZE/BANDAGES/DRESSINGS) IMPLANT
SUT ETHILON 3 0 FSL (SUTURE) IMPLANT
SUT VIC AB 3-0 SH 8-18 (SUTURE) ×2 IMPLANT
SUT VICRYL 3-0 RB1 18 ABS (SUTURE) ×2 IMPLANT
TAPE CLOTH 3X10 TAN LF (GAUZE/BANDAGES/DRESSINGS) ×2 IMPLANT
TOWEL GREEN STERILE (TOWEL DISPOSABLE) ×2 IMPLANT
TOWEL GREEN STERILE FF (TOWEL DISPOSABLE) ×2 IMPLANT
WATER STERILE IRR 1000ML POUR (IV SOLUTION) ×2 IMPLANT

## 2021-02-01 NOTE — Transfer of Care (Signed)
Immediate Anesthesia Transfer of Care Note  Patient: Johnny Casey  Procedure(s) Performed: Anterior Cervical Decompression Fusion  Cervical three-four, Cervical four-five  Patient Location: PACU  Anesthesia Type:General  Level of Consciousness: awake and alert   Airway & Oxygen Therapy: Patient Spontanous Breathing and Patient connected to face mask oxygen  Post-op Assessment: Report given to RN, Post -op Vital signs reviewed and stable and Patient moving all extremities X 4  Post vital signs: Reviewed and stable  Last Vitals:  Vitals Value Taken Time  BP 159/86 02/01/21 1415  Temp 36.5C 02/01/21 1423  Pulse 88 02/01/21 1423  Resp 18 02/01/21 1423  SpO2 95 % 02/01/21 1423  Vitals shown include unvalidated device data.  Last Pain:  Vitals:   02/01/21 0949  TempSrc:   PainSc: 9          Complications: No notable events documented.

## 2021-02-01 NOTE — Progress Notes (Addendum)
Elevated BP when patient arrived in short stay - 232/98. Patient had Metoprolol 12.5 mg last night but not this morning. Per Dr. Tobias Alexander verbal order, patient received Labetalol 5 mg IV and BP decreased to 204/97. Will continue to monitor.

## 2021-02-01 NOTE — Op Note (Signed)
NEUROSURGERY OPERATIVE NOTE   PREOP DIAGNOSIS: Cervical Stenosis with Myelopathy, C3-4, C4-5  POSTOP DIAGNOSIS: Same  PROCEDURE: 1. Discectomy at C3-4, C4-5 for decompression of spinal cord and exiting nerve roots  2. Placement of intervertebral biomechanical device, Medtronic Titan 34mm lordotic @ C4-5, 75mm lordotic @ C3-4 3. Placement of anterior instrumentation consisting of interbody plate and screws - Medtronic Zevo 8mm plate x2, 36UY screws x8 4. Use of morselized bone allograft  5. Arthrodesis C3-4, C4-5, anterior interbody technique  6. Use of intraoperative microscope  SURGEON: Dr. Consuella Lose, MD  ASSISTANT: Dr. Ashok Pall, MD  ANESTHESIA: General Endotracheal  EBL: 50cc  SPECIMENS: None  DRAINS: None  COMPLICATIONS: None immediated  CONDITION: Hemodynamically stable to PACU  HISTORY: Johnny Casey is a 70 y.o. initially seen in the outpatient neurosurgery clinic complaining of progressively worsening gait instability and frequent falls as well as bilateral upper extremity paresthesias.  Patient also has concomitant right rotator cuff pathology and associated right bicep weakness.  His work-up included MRI of the cervical spine revealing moderate to severe stenosis at C4-5 and moderate stenosis at C3-4.  There is associated T2 signal change within the spinal cord at the C4-5 level.  With his clinical and radiographic findings, surgical decompression was indicated.  Of note, the patient did have previous ACDF at the C6-7 level several years prior.  Right sided skin incision was identified.  The risks, benefits, and alternatives to surgery were all reviewed in detail with the patient.  After all his questions were answered informed consent was obtained and witnessed.  PROCEDURE IN DETAIL: The patient was brought to the operating room and transferred to the operative table. After induction of general anesthesia, the patient was positioned on the operative table in  the supine position with all pressure points meticulously padded. The skin of the neck was then prepped and draped in the usual sterile fashion.  Timeout was conducted.  The previous skin incision was identified, and a more superior right-sided transverse skin incision was marked out in order to access the C3-4 and C4-5 levels. The skin was infiltrated with local anesthetic. Skin incision was then made sharply and Bovie electrocautery was used to dissect the subcutaneous tissue until the platysma was identified. The platysma was then divided and undermined. The sternocleidomastoid muscle was then identified and, utilizing natural fascial planes in the neck, the prevertebral fascia was identified and the carotid sheath was retracted laterally and the trachea and esophagus retracted medially. Again using fluoroscopy, spinal needle was introduced and the C3-4 level was identified.  Dissection was carried further inferior to identify the C4-5 level.  Bovie electrocautery was used to dissect in the subperiosteal plane and elevate the bilateral longus coli muscles. Table mounted retractors were then placed. At this point, the microscope was draped and brought into the field, and the remainder of the case was done under the microscope using microdissecting technique.  The C4-5 disc space was incised sharply and rongeurs were use to initially complete a discectomy. The high-speed drill was then used to complete discectomy until the posterior annulus was identified and removed and the posterior longitudinal ligament was identified. Using a nerve hook, the PLL was elevated, and Kerrison rongeurs were used to remove the posterior longitudinal ligament and the ventral thecal sac was identified. Using a combination of curettes and rongeurs, complete decompression of the thecal sac and exiting nerve roots at this level was completed, and verified using micro-nerve hook.  This did include removal of  the posterior part of the  uncovertebral joints bilaterally.  While there did not seem to be any significant amount of foraminal stenosis on the patient's left, there was stenosis on the right side.  Once decompression was completed I was easily able to pass a small dissector of the right foramen.  At this point, a 7 mm small width lordotic interbody cage was sized and packed with morcellized bone allograft.  Endplates were prepared with curettes.  Cage was then inserted and tapped into place.  A 19 mm plate was then placed across the interspace.  15 mm screws were then placed into C4 and C5.  Attention was then turned to the C3-4 level. In a similar fashion, discectomy was completed initially with curettes and rongeurs, and completed with the drill. The PLL was again identified, elevated and incised. Using Kerrison rongeurs complete decompression of the thecal sac was obtained by me removal of the posterior longitudinal ligament as well as a portion of the uncovertebral joints.  A 6 mm small width lordotic interbody cage was then sized and filled with bone allograft.C3 and C4 endplates were then prepared with curettes.  The cage was tapped into place.  Another 19 mm plate was then placed across the interspace and 15 mm screws were used to secure in C3 and C4.  Position of the interbody devices was then confirmed with fluoroscopy. Final fluoroscopic images in lateral projection were taken to confirm good hardware placement.  At this point, after all counts were verified to be correct, meticulous hemostasis was secured using a combination of bipolar electrocautery and passive hemostatics. The platysma muscle was then closed using interrupted 3-0 Vicryl sutures, and the skin was closed with a interrupted subcuticular stitch. Sterile dressings were then applied and the drapes removed.  The patient tolerated the procedure well and was extubated in the room and taken to the postanesthesia care unit in stable condition.   Consuella Lose, MD [facility]

## 2021-02-01 NOTE — H&P (Signed)
Chief Complaint  Difficulty walking, right arm weakness   History of Present Illness  Johnny Casey is a 70 y.o. male initially seen in the outpatient neurosurgery clinic complaining primarily of worsening gait instability over the last year.  He has noted progressively more frequent falls over the last few months.  He has also noted worsening posterior neck pain and paresthesias in both hands.  He does apparently also have a rotator cuff injury on the right side and has had significant difficulty moving his shoulder and right bicep weakness.  His work-up did include MRI of the cervical spine revealing moderate to severe stenosis at C3-4 and C4-5 with intrinsic spinal cord signal change.  Surgical decompression was therefore recommended.  Patient did receive preoperative evaluation from his cardiologist and deemed to be low risk for the procedure.  He has stopped his aspirin for the last week.  Past Medical History   Past Medical History:  Diagnosis Date   Anxiety    Arthritis    BPH (benign prostatic hyperplasia)    Cancer (HCC)    melanoma on back   CKD (chronic kidney disease)    COPD (chronic obstructive pulmonary disease) (HCC)    Diabetes mellitus without complication (HCC)    GERD (gastroesophageal reflux disease)    Hepatitis    fatty liver   Hypertension    PUD (peptic ulcer disease)    s/p APC 08/2019   Sleep apnea     Past Surgical History   Past Surgical History:  Procedure Laterality Date   ANTERIOR CERVICAL DECOMP/DISCECTOMY FUSION     C6-7 ACDF 07/30/16 per [facility]     bilateral cataracts   TONSILLECTOMY      Social History   Social History   Tobacco Use   Smoking status: Every Day    Packs/day: 0.50    Types: Cigarettes   Smokeless tobacco: Former  Substance Use Topics   Alcohol use: Not Currently    Medications   Prior to Admission medications   Medication Sig Start  Date End Date Taking? Authorizing Provider  albuterol (VENTOLIN HFA) 108 (90 Base) MCG/ACT inhaler Inhale 2 puffs into the lungs every 6 (six) hours as needed for wheezing or shortness of breath.   Yes [provider]  aspirin EC 81 MG tablet Take 81 mg by mouth daily. Swallow whole.   Yes [provider]  busPIRone (BUSPAR) 15 MG tablet Take 15 mg by mouth 2 (two) times daily. 12/06/19  Yes [provider]  Calcium Carb-Cholecalciferol 600-400 MG-UNIT TABS Take 1 tablet by mouth in the morning and at bedtime. 07/21/19  Yes [provider]  cetirizine (ZYRTEC) 10 MG tablet Take 10 mg by mouth in the morning. 09/22/19  Yes [provider]  Cholecalciferol 25 MCG (1000 UT) tablet Take 1,000 Units by mouth daily. 03/22/19  Yes [provider]  cyclobenzaprine (FLEXERIL) 10 MG tablet Take 10 mg by mouth 3 (three) times daily as needed for muscle spasms.   Yes [provider]  DULoxetine (CYMBALTA) 60 MG capsule Take 60 mg by mouth 2 (two) times daily. 10/17/19  Yes [provider]  famotidine (PEPCID) 20 MG tablet Take 20 mg by mouth at bedtime. 09/22/19  Yes [provider]  lidocaine (LIDODERM) 5 % Place 1 patch onto the skin daily as needed (pain). Remove & Discard patch within 12  hours or as directed by MD   Yes [provider]  linaclotide (LINZESS) 145 MCG CAPS capsule Take 145 mcg by mouth daily before breakfast.   Yes [provider]  Magnesium Oxide 420 MG TABS Take 420 mg by mouth in the morning and at bedtime. 07/21/19  Yes [provider]  metoprolol tartrate (LOPRESSOR) 25 MG tablet Take 12.5 mg by mouth at bedtime. 07/21/19  Yes [provider]  Omega-3 Fatty Acids (FISH OIL) 500 MG CAPS Take 1,000 mg by mouth in the morning and at bedtime.   Yes [provider]  Oxcarbazepine (TRILEPTAL) 300 MG tablet Take 300 mg by mouth in the morning and at bedtime. 12/08/19  Yes [provider]  pantoprazole (PROTONIX) 40 MG tablet Take 40 mg by mouth in the morning. 09/02/20  Yes [provider]  pravastatin (PRAVACHOL) 10 MG tablet Take 10 mg by mouth daily.   Yes [provider]  ranolazine (RANEXA) 500 MG 12 hr tablet Take 500 mg by mouth in the morning and at bedtime. 12/07/19  Yes [provider]  rOPINIRole (REQUIP) 0.25 MG tablet Take 0.25 mg by mouth at bedtime. 12/15/19  Yes [provider]  tamsulosin (FLOMAX) 0.4 MG CAPS capsule Take 0.8 mg by mouth at bedtime.   Yes [provider]  insulin regular (NOVOLIN R) 100 units/mL injection Inject 10 Units into the skin 2 (two) times daily as needed (blood sugar above 150). 04/14/19   [provider]    Allergies   Allergies  Allergen Reactions   Lactulose Diarrhea    Review of Systems  ROS  Neurologic Exam  Awake, alert, oriented Memory and concentration grossly intact Speech fluent, appropriate CN grossly intact Motor exam: Upper Extremities Deltoid Bicep Tricep Grip  Right 5/5 2/5 4/5 5/5  Left 5/5 5/5 5/5 5/5   Lower Extremities IP Quad PF DF EHL  Right 5/5 5/5 5/5 5/5 5/5  Left 5/5 5/5 5/5 5/5 5/5   Sensation grossly intact to LT  Imaging  MRI of the cervical spine dated 01/01/2021 was reviewed and reveals broad-based disc bulge at C3-4 and C4-5 with moderately severe stenosis at 4 5 and moderate stenosis at 3 4.  There is T2 signal change within the left hemicord at C4-5.  Impression  - 70 y.o. male with signs and symptoms of upper cervical myelopathy with MRI demonstrating moderate to severe stenosis at C3-4 and C4-5  Plan  -We will plan on proceeding with ACDF at C3-4 C4-5  I have reviewed the indications for surgery as well as the expected postoperative course and recovery.  We have discussed the associated risks, benefits, and alternatives to surgery.  All his questions today were answered.  He provided informed consent to  proceed.   Consuella Lose, MD [facility]

## 2021-02-01 NOTE — Anesthesia Procedure Notes (Signed)
Procedure Name: Intubation Date/Time: 02/01/2021 11:49 AM Performed by: Reece Agar, CRNA Pre-anesthesia Checklist: Patient identified, Emergency Drugs available, Suction available and Patient being monitored Patient Re-evaluated:Patient Re-evaluated prior to induction Oxygen Delivery Method: Circle System Utilized Preoxygenation: Pre-oxygenation with 100% oxygen Induction Type: IV induction Ventilation: Mask ventilation without difficulty Laryngoscope Size: Miller and 2 Grade View: Grade I Tube type: Oral Tube size: 7.5 mm Number of attempts: 1 Airway Equipment and Method: Stylet Placement Confirmation: ETT inserted through vocal cords under direct vision, positive ETCO2 and breath sounds checked- equal and bilateral Secured at: 23 cm Tube secured with: Tape Dental Injury: Teeth and Oropharynx as per pre-operative assessment

## 2021-02-01 NOTE — Anesthesia Postprocedure Evaluation (Signed)
Anesthesia Post Note  Patient: Johnny Casey  Procedure(s) Performed: Anterior Cervical Decompression Fusion  Cervical three-four, Cervical four-five     Patient location during evaluation: PACU Anesthesia Type: General Level of consciousness: sedated Pain management: pain level controlled Vital Signs Assessment: post-procedure vital signs reviewed and stable Respiratory status: spontaneous breathing and respiratory function stable Cardiovascular status: stable Postop Assessment: no apparent nausea or vomiting Anesthetic complications: no   No notable events documented.  Last Vitals:  Vitals:   02/01/21 1445 02/01/21 1500  BP: (!) 148/85 (!) 148/85  Pulse: 87 88  Resp: 15 (!) 21  Temp:    SpO2: 96% 97%                 [name] DANIEL

## 2021-02-01 NOTE — Progress Notes (Addendum)
Patient received additional dose of Labetalol 5 mg IV @ 10:51 o'clock per MD verbal orders. Will continue to monitor.

## 2021-02-02 ENCOUNTER — Observation Stay (HOSPITAL_COMMUNITY): Payer: No Typology Code available for payment source

## 2021-02-02 LAB — GLUCOSE, CAPILLARY
Glucose-Capillary: 165 mg/dL — ABNORMAL HIGH (ref 70–99)
Glucose-Capillary: 136 mg/dL — ABNORMAL HIGH (ref 70–99)
Glucose-Capillary: 144 mg/dL — ABNORMAL HIGH (ref 70–99)
Glucose-Capillary: 149 mg/dL — ABNORMAL HIGH (ref 70–99)
Glucose-Capillary: 124 mg/dL — ABNORMAL HIGH (ref 70–99)
Glucose-Capillary: 124 mg/dL — ABNORMAL HIGH (ref 70–99)

## 2021-02-02 NOTE — Progress Notes (Signed)
PT Cancellation Note  Patient Details Name: OSKAR CRETELLA MRN: [id number] DOB: [birth year]   Cancelled Treatment:    Reason Eval/Treat Not Completed: Patient at procedure or test/unavailable Patient off unit for imaging. PT will re-attempt as time allows.   Talya Quain A. Gilford Rile PT, DPT Acute Rehabilitation Services Pager [phone] Office [phone]    Linna Hoff 02/02/2021, 10:07 AM

## 2021-02-02 NOTE — Evaluation (Signed)
Occupational Therapy Evaluation Patient Details Name: Johnny Casey MRN: [id number] DOB: 1951/04/22 Today's Date: 02/02/2021   History of Present Illness 70 yo male s/p C3-5 ACDF on 9/23. Pt with recent falls. Imaging of R shoulder showing no acute changes. PMH including anxiety, arthritis, cancer, BPH, COPD, DM, hepatitis, HTN, prior back sx, and prior C6-7 ACDF.   Clinical Impression   PTA, pt was living at home with wife, Independent in ADLs with [facility], and wife completed IADLs. Pt currently requiring Min A for UB ADLs, LB ADLs, and functional mobility. Pt educated on precautions, LB dressing compensatory technique, and safe bed mobility while adhering to cervical precautions. Pt with good demonstration of precautions, requiring min verbal cues to maintain throughout. Pt reported increased falls this past month due to intermittent jerking and shaking in his legs causing LOBs. Pt with increased fall risk, decreased functional strength, and decreased balance limiting ability to complete ADLs safely and independently. Due to PLOF, need for intensive rehab, and high motivation to improve independence, recommending CIR post d/c. Will continue to follow in the acute setting to reinforce precautions, address UB dressing, and optimize safety.     Recommendations for follow up therapy are one component of a multi-disciplinary discharge planning process, led by the attending physician.  Recommendations may be updated based on patient status, additional functional criteria and insurance authorization.   Follow Up Recommendations  CIR    Equipment Recommendations  None recommended by OT    Recommendations for Other Services       Precautions / Restrictions Precautions Precautions: Cervical Precaution Booklet Issued: No Precaution Comments: Cervical collar can be removed when toileting, showering, and in bed. Required Braces or Orthoses: Cervical Brace Cervical Brace: Hard  collar Restrictions Weight Bearing Restrictions: No      Mobility Bed Mobility Overal bed mobility: Needs Assistance Bed Mobility: Rolling;Sidelying to Sit Rolling: Supervision Sidelying to sit: Min assist       General bed mobility comments: Pt used bed rail to pull into sitting, needed min A to balance self sitting upright EOB. Pt with LOB that PT corrected.    Transfers Overall transfer level: Needs assistance Equipment used: 1 person hand held assist Transfers: Sit to/from Stand Sit to Stand: Min assist         General transfer comment: for safety, pt with unsteadiness on feet.    Balance Overall balance assessment: History of Falls;Needs assistance Sitting-balance support: Feet supported;No upper extremity supported Sitting balance-Leahy Scale: Fair Sitting balance - Comments: With LOB initially in sitting that required therapist to correct, pt progressed to sitting EOB donning pants with min guard assist.   Standing balance support: Single extremity supported Standing balance-Leahy Scale: Poor Standing balance comment: Functional mobility to chair with one UE support from therapist                           ADL either performed or assessed with clinical judgement   ADL Overall ADL's : Needs assistance/impaired Eating/Feeding: Supervision/ safety   Grooming: Sitting;Set up;Supervision/safety   Upper Body Bathing: Min A;Supervision/ safety;Sitting   Lower Body Bathing: Minimal assistance;Sit to/from stand   Upper Body Dressing : Sitting;Minimal assistance   Lower Body Dressing: Minimal assistance;Cueing for back precautions;Sit to/from stand Lower Body Dressing Details (indicate cue type and reason): Pt requiring min a for tying pants (unable to look downwards to tie) and to balance sitting EOB. Toilet Transfer: Minimal Print production planner Details (indicate cue type  and reason): Simulated with bed, for safety during sit<>stand, pt unsteady  on feet needing one UE assist Toileting- Clothing Manipulation and Hygiene: Minimal assistance;Sit to/from stand Toileting - Clothing Manipulation Details (indicate cue type and reason): Pt requiring min a for tying pants (unable to look downwards to tie) and to balance sitting EOB     Functional mobility during ADLs: Cueing for safety;Minimal assistance General ADL Comments: Pt educated on cervical precautions, safe bed mobility, and safe LB dressing cempensatory technique to avoid bending spine. Pt with decreased functional strength, activity tolerance, and with increased falls due to intermittent jerking/shaking of legs that occurs "every 2-3 days" Pt with functional mobility to chair with Min A.      Vision Baseline Vision/History: 0 No visual deficits Ability to See in Adequate Light: 0 Adequate Patient Visual Report: No change from baseline       Perception     Praxis      Pertinent Vitals/Pain Pain Assessment: Faces Faces Pain Scale: Hurts little more Pain Location: Neck Pain Descriptors / Indicators: Discomfort;Grimacing Pain Intervention(s): Monitored during session     Hand Dominance     Extremity/Trunk Assessment Upper Extremity Assessment Upper Extremity Assessment: Generalized weakness   Lower Extremity Assessment Lower Extremity Assessment: Defer to PT evaluation   Cervical / Trunk Assessment Cervical / Trunk Assessment: Other exceptions (s/p cervical surgery) Cervical / Trunk Exceptions: s/p cervical surgery   Communication Communication Communication: No difficulties   Cognition Arousal/Alertness: Awake/alert Behavior During Therapy: WFL for tasks assessed/performed Overall Cognitive Status: Within Functional Limits for tasks assessed                                 General Comments: Pt able to follow multistep commands.   General Comments       Exercises     Shoulder Instructions      Home Living Family/patient expects to be  discharged to:: Private residence Living Arrangements: Spouse/significant other Available Help at Discharge: Family Type of Home: House Home Access: Stairs to enter Technical brewer of Steps: 1   Home Layout: One level     Bathroom Shower/Tub: Teacher, early years/pre: [place] - single point;Grab bars - tub/shower;Tub bench;Wheelchair - manual          Prior Functioning/Environment Level of Independence: Independent with assistive device(s)        Comments: used SPC for short mobility and furniture walked in the house. Uses w/c primarily for community mobility. Multiple falls in past month due to sudden jerking/shaking of BLEs resulting in fall. Pt independent in ADLs, wife completes cooking and cleaning.        OT Problem List: Decreased strength;Decreased range of motion;Decreased activity tolerance;Impaired balance (sitting and/or standing);Pain      OT Treatment/Interventions: Self-care/ADL training;Therapeutic exercise;DME and/or AE instruction;Therapeutic activities;Patient/family education;Balance training    OT Goals(Current goals can be found in the care plan section) Acute Rehab OT Goals Patient Stated Goal: to improve endurance, strength, and balance. OT Goal Formulation: With patient Time For Goal Achievement: 02/15/21 Potential to Achieve Goals: Good  OT Frequency: Min 2X/week   Barriers to D/C:            Co-evaluation PT/OT/SLP Co-Evaluation/Treatment: Yes Reason for Co-Treatment: For patient/therapist safety;To address functional/ADL transfers          AM-PAC OT "6 Clicks" Daily Activity     Outcome Measure  Help from another person eating meals?: A Little Help from another person taking care of personal grooming?: A Little Help from another person toileting, which includes using toliet, bedpan, or urinal?: A Little Help from another person bathing (including washing, rinsing, drying)?: A Little Help  from another person to put on and taking off regular upper body clothing?: A Little Help from another person to put on and taking off regular lower body clothing?: A Little 6 Click Score: 18   End of Session Equipment Utilized During Treatment: Gait belt;Cervical collar Nurse Communication: Mobility status  Activity Tolerance: Patient tolerated treatment well Patient left: in chair;with chair alarm set  OT Visit Diagnosis: Unsteadiness on feet (R26.81);Repeated falls (R29.6);Muscle weakness (generalized) (M62.81);History of falling (Z91.81);Pain Pain - part of body:  (back)                Time: 1610-9604 OT Time Calculation (min): 23 min Charges:  OT General Charges $OT Visit: 1 Visit OT Evaluation $OT Eval Moderate Complexity: 1 Mod  [name], OTS Acute Rehab Office: [phone]  Johnny Casey 02/02/2021, 12:56 PM

## 2021-02-02 NOTE — Progress Notes (Signed)
Patient ID: Johnny Casey, male   DOB: 1951-05-08, 70 y.o.   MRN: [id number] Status post anterior cervical decompression for myelopathy. Patient appears awake and alert.  Complains of severe right shoulder pain which she is concerned of secondary to possible shoulder injury.  He was told at the [place] that he has a rotator cuff tear.  He would like to have further work-up and intervention for this process at the current time.  He also is complaining of left chest wall pain secondary to his fall.  He is concerned about rib fractures.  We will obtain some rib films and the shoulder film we will ask Ortho to see him regarding the right rotator cuff.

## 2021-02-02 NOTE — Evaluation (Signed)
Physical Therapy Evaluation Patient Details Name: Johnny Casey MRN: [id number] DOB: [birth year] Today's Date: 02/02/2021  History of Present Illness  70 yo male s/p C3-5 ACDF on 9/23. Pt with recent falls. Imaging of R shoulder showing no acute changes. PMH including anxiety, arthritis, cancer, BPH, COPD, DM, hepatitis, HTN, prior back sx, and prior C6-7 ACDF.  Clinical Impression  PTA, patient lives with wife and independent with mobility with use of SPC. Patient reports multiple falls due to sudden jerking/shaking of body resulting in fall. Patient reports 7-8 falls in last month but able to catch himself in chair ~20 times. Patient places chairs throughout the house for safety. Patient requires minA for transfers and pivotal steps with no AD. +2 for safety with chair follow for gait progression. Patient presents with generalized weakness, decreased endurance, impaired balance. Educated patient on cervical precautions with fair adherence. Patient will benefit from skilled PT services during acute stay to address listed deficits. Recommend CIR following discharge to maximize functional independence and address listed deficits to improve safety and mobility.     Recommendations for follow up therapy are one component of a multi-disciplinary discharge planning process, led by the attending physician.  Recommendations may be updated based on patient status, additional functional criteria and insurance authorization.  Follow Up Recommendations CIR    Equipment Recommendations  Other (comment) (TBD)    Recommendations for Other Services Rehab consult     Precautions / Restrictions Precautions Precautions: Cervical Precaution Booklet Issued: No Precaution Comments: Cervical collar can be removed when toileting, showering, and in bed. Required Braces or Orthoses: Cervical Brace Cervical Brace: Hard collar Restrictions Weight Bearing Restrictions: No      Mobility  Bed Mobility Overal bed  mobility: Needs Assistance Bed Mobility: Rolling;Sidelying to Sit Rolling: Supervision Sidelying to sit: Min assist       General bed mobility comments: Pt used bed rail to pull into sitting, needed min A to balance self sitting upright EOB. x1 LOB with assist to correct.    Transfers Overall transfer level: Needs assistance Equipment used: 1 person hand held assist Transfers: Sit to/from [facility] Sit to Stand: Min assist;+2 safety/equipment Stand pivot transfers: Min assist;+2 safety/equipment       General transfer comment: for safety, pt with unsteadiness on feet. Pivotal steps towards recliner with minA+2 for balance and safety  Ambulation/Gait                Stairs            Wheelchair Mobility    Modified Rankin (Stroke Patients Only)       Balance Overall balance assessment: History of Falls;Needs assistance Sitting-balance support: Feet supported;No upper extremity supported Sitting balance-Leahy Scale: Fair Sitting balance - Comments: With LOB initially in sitting that required therapist to correct, pt progressed to sitting EOB donning pants with min guard assist.   Standing balance support: Single extremity supported Standing balance-Leahy Scale: Poor Standing balance comment: Functional mobility to chair with one UE support from therapist                             Pertinent Vitals/Pain Pain Assessment: Faces Faces Pain Scale: Hurts little more Pain Location: Neck Pain Descriptors / Indicators: Discomfort;Grimacing Pain Intervention(s): Monitored during session    Home Living Family/patient expects to be discharged to:: Private residence Living Arrangements: Spouse/significant other Available Help at Discharge: Family Type of Home: House Home Access: Stairs to  enter   Entrance Stairs-Number of Steps: 1 Home Layout: One level Home Equipment: Cane - single point;Grab bars - tub/shower;Tub bench;Wheelchair  - manual      Prior Function Level of Independence: Independent with assistive device(s)         Comments: used SPC for short mobility and furniture walked in the house. Uses w/c primarily for community mobility. Multiple falls in past month due to sudden jerking/shaking of BLEs resulting in fall (keeps chairs throughout house in case of instance of sudden jerking/shaking). Pt independent in ADLs, wife completes cooking and cleaning.     Hand Dominance   Dominant Hand: Left    Extremity/Trunk Assessment   Upper Extremity Assessment Upper Extremity Assessment: Defer to OT evaluation    Lower Extremity Assessment Lower Extremity Assessment: Generalized weakness    Cervical / Trunk Assessment Cervical / Trunk Assessment: Other exceptions (s/p cervical surgery) Cervical / Trunk Exceptions: s/p cervical surgery  Communication   Communication: No difficulties  Cognition Arousal/Alertness: Awake/alert Behavior During Therapy: WFL for tasks assessed/performed Overall Cognitive Status: Within Functional Limits for tasks assessed                                 General Comments: Pt able to follow multistep commands.      General Comments      Exercises     Assessment/Plan    PT Assessment Patient needs continued PT services  PT Problem List Decreased strength;Decreased activity tolerance;Decreased balance;Decreased mobility;Decreased coordination;Decreased safety awareness       PT Treatment Interventions DME instruction;Gait training;Functional mobility training;Stair training;Therapeutic activities;Therapeutic exercise;Balance training;Neuromuscular re-education;Patient/family education    PT Goals (Current goals can be found in the Care Plan section)  Acute Rehab PT Goals Patient Stated Goal: to improve endurance, strength, and balance. PT Goal Formulation: With patient Time For Goal Achievement: 02/16/21 Potential to Achieve Goals: Fair     Frequency Min 5X/week   Barriers to discharge        Co-evaluation PT/OT/SLP Co-Evaluation/Treatment: Yes Reason for Co-Treatment: For patient/therapist safety;To address functional/ADL transfers PT goals addressed during session: Mobility/safety with mobility;Balance         AM-PAC PT "6 Clicks" Mobility  Outcome Measure Help needed turning from your back to your side while in a flat bed without using bedrails?: A Little Help needed moving from lying on your back to sitting on the side of a flat bed without using bedrails?: A Little Help needed moving to and from a bed to a chair (including a wheelchair)?: A Little Help needed standing up from a chair using your arms (e.g., wheelchair or bedside chair)?: A Little Help needed to walk in hospital room?: A Lot Help needed climbing 3-5 steps with a railing? : A Lot 6 Click Score: 16    End of Session Equipment Utilized During Treatment: Gait belt;Cervical collar Activity Tolerance: Patient tolerated treatment well Patient left: in chair;with call bell/phone within reach;with chair alarm set Nurse Communication: Mobility status PT Visit Diagnosis: Unsteadiness on feet (R26.81);Repeated falls (R29.6);Muscle weakness (generalized) (M62.81);History of falling (Z91.81);Other abnormalities of gait and mobility (R26.89)    Time: 9622-2979 PT Time Calculation (min) (ACUTE ONLY): 24 min   Charges:   PT Evaluation $PT Eval Moderate Complexity: 1 Mod PT Treatments $Therapeutic Activity: 8-22 mins        Curtina Grills A. Gilford Rile PT, DPT Acute Rehabilitation Services Pager [phone] Office [phone]   Linna Hoff 02/02/2021, 1:23  PM

## 2021-02-02 NOTE — Progress Notes (Signed)
OT Cancellation Note  Patient Details Name: Johnny Casey MRN: [id number] DOB: 1951-04-08   Cancelled Treatment:    Reason Eval/Treat Not Completed: Patient at procedure or test/ unavailable (Attempted to see pt, pt not in room due to imaging procedure upon arrival. Will return as schedule allows.)  [name], OTS Acute Rehab Office: [phone]   [name] 02/02/2021, 10:08 AM

## 2021-02-03 LAB — GLUCOSE, CAPILLARY
Glucose-Capillary: 143 mg/dL — ABNORMAL HIGH (ref 70–99)
Glucose-Capillary: 114 mg/dL — ABNORMAL HIGH (ref 70–99)
Glucose-Capillary: 162 mg/dL — ABNORMAL HIGH (ref 70–99)
Glucose-Capillary: 109 mg/dL — ABNORMAL HIGH (ref 70–99)

## 2021-02-03 NOTE — Progress Notes (Signed)
Patient ID: Johnny Casey, male   DOB: [birth year], 70 y.o.   MRN: [id number] BP (!) 184/104 (BP Location: Right Arm)   Pulse 95   Temp 97.7 F (36.5 C) (Oral)   Resp 18   Ht 5\' 10"  (1.778 m)   Wt 73.5 kg   SpO2 95%   BMI 23.24 kg/m   Wound is clean, dry, no signs of infection  Moving upper extremities Working with therapies, myelopathic

## 2021-02-03 NOTE — Progress Notes (Addendum)
Inpatient Rehab Admissions Coordinator:   Per therapy recommendations,  patient was screened for CIR candidacy by Clemens Catholic, MS, CCC-SLP. At this time, Pt. Appears to demonstrate medical necessity, functional decline, and ability to tolerate intensity of CIR. Pt. is a potential candidate for CIR. I will place   order for rehab consult per protocol for full assessment of candidacy. Please contact me any with questions.Clemens Catholic, [place], [place] Admissions Coordinator  [phone] ([place]) [phone] (office)

## 2021-02-04 LAB — GLUCOSE, CAPILLARY
Glucose-Capillary: 124 mg/dL — ABNORMAL HIGH (ref 70–99)
Glucose-Capillary: 138 mg/dL — ABNORMAL HIGH (ref 70–99)
Glucose-Capillary: 112 mg/dL — ABNORMAL HIGH (ref 70–99)
Glucose-Capillary: 154 mg/dL — ABNORMAL HIGH (ref 70–99)

## 2021-02-04 NOTE — Progress Notes (Signed)
  NEUROSURGERY PROGRESS NOTE   No issues overnight. Has some mild dysphagia with pills, tolerating soft foods.  EXAM:  BP 102/67 (BP Location: Left Arm)   Pulse (!) 105   Temp 98.2 F (36.8 C) (Oral)   Resp 20   Ht 5\' 10"  (1.778 m)   Wt 73.5 kg   SpO2 94%   BMI 23.24 kg/m   Awake, alert, oriented  Speech fluent, appropriate  CN grossly intact  MAE well, stable RUE weakness. Wound c/d/i  IMPRESSION:  70 y.o. male s/p ACDF for stenosis with myelopathy. At baseline.  PLAN: - Cont therapy, may need CIR   Consuella Lose, MD [facility]

## 2021-02-04 NOTE — Progress Notes (Signed)
Inpatient Rehab Admissions Coordinator:   Met with patient at bedside.  Reviewed recommendations for CIR and expectations/goals of CIR stay.  Reviewed average LOS 2 weeks dependent upon progress and goals of supervision.  Pt reports difficulty with memory, no family at bedside for baseline.  I will need to talk to wife to confirm dispo.  Also note pt observation status and may not be able to get [place] auth unless he qualifies for inpatient status.  Will f/u tomorrow.   Shann Medal, PT, DPT Admissions Coordinator [phone] 02/04/21  4:35 PM

## 2021-02-04 NOTE — Progress Notes (Signed)
Physical Therapy Treatment Patient Details Name: Johnny Casey MRN: [id number] DOB: 05-Dec-1950 Today's Date: 02/04/2021   History of Present Illness 70 yo male s/p C3-5 ACDF on 9/23. Pt with recent falls. Imaging of R shoulder showing no acute changes. PMH including anxiety, arthritis, cancer, BPH, COPD, DM, hepatitis, HTN, prior back sx, and prior C6-7 ACDF.    PT Comments    Pt received supine in bed. Pt able to come to EOB keeping precautions with increased time and supervision. Pt practiced donning and doffing of brace and postural exercises in sitting. Pt ambulated 27' with RW and min A, 3x, with rest breaks for fatigue. PT will continue to follow.    Recommendations for follow up therapy are one component of a multi-disciplinary discharge planning process, led by the attending physician.  Recommendations may be updated based on patient status, additional functional criteria and insurance authorization.  Follow Up Recommendations  CIR     Equipment Recommendations  Rolling walker with 5" wheels    Recommendations for Other Services Rehab consult     Precautions / Restrictions Precautions Precautions: Cervical Precaution Booklet Issued: No Precaution Comments: Cervical collar can be removed when toileting, showering, and in bed. Required Braces or Orthoses: Cervical Brace Cervical Brace: Hard collar Restrictions Weight Bearing Restrictions: No     Mobility  Bed Mobility Overal bed mobility: Needs Assistance Bed Mobility: Rolling;Sidelying to Sit Rolling: Supervision Sidelying to sit: Supervision       General bed mobility comments: pt needs increased time to get to EOB but from elevated HOB (has adjustable bed at home), is able to come to sitting without physical assist and with keeping precautions    Transfers Overall transfer level: Needs assistance Equipment used: Rolling walker (2 wheeled) Transfers: Sit to/from Stand Sit to Stand: Min guard          General transfer comment: Min guard for safety, vc for safe hand placement  Ambulation/Gait Ambulation/Gait assistance: Min assist Gait Distance (Feet): 50 Feet (3x) Assistive device: Rolling walker (2 wheeled) Gait Pattern/deviations: Step-through pattern;Narrow base of support Gait velocity: decreased Gait velocity interpretation: <1.8 ft/sec, indicate of risk for recurrent falls General Gait Details: pt began with RW far in front of him, lowered RW and improved pt safety. Pt needed 2 rest breaks due to LE fatigue. Dependent on RW   Stairs             Wheelchair Mobility    Modified Rankin (Stroke Patients Only)       Balance Overall balance assessment: History of Falls;Needs assistance Sitting-balance support: Feet supported;No upper extremity supported Sitting balance-Leahy Scale: Good Sitting balance - Comments: maintain static sitting EOB with safety, needs min-guard A with challenges   Standing balance support: Bilateral upper extremity supported Standing balance-Leahy Scale: Poor Standing balance comment: reliant on UE support                            Cognition Arousal/Alertness: Awake/alert Behavior During Therapy: WFL for tasks assessed/performed Overall Cognitive Status: Within Functional Limits for tasks assessed                                 General Comments: pt doing well remembering and keeping precautions with mvmt, also shows good grasp of proper posture      Exercises Other Exercises Other Exercises: shoulder shrugs x10 Other Exercises: scapular retraction x10 (  minimal mvmt of B scapulae noted)    General Comments General comments (skin integrity, edema, etc.): pt donned and doffed brace with min A      Pertinent Vitals/Pain Pain Assessment: Faces Faces Pain Scale: Hurts little more Pain Location: Neck Pain Descriptors / Indicators: Discomfort;Grimacing Pain Intervention(s): Limited activity within  patient's tolerance;Monitored during session    Home Living                      Prior Function            PT Goals (current goals can now be found in the care plan section) Acute Rehab PT Goals Patient Stated Goal: to improve endurance, strength, and balance. PT Goal Formulation: With patient Time For Goal Achievement: 02/16/21 Potential to Achieve Goals: Fair Progress towards PT goals: Progressing toward goals    Frequency    Min 5X/week      PT Plan Current plan remains appropriate    Co-evaluation              AM-PAC PT "6 Clicks" Mobility   Outcome Measure  Help needed turning from your back to your side while in a flat bed without using bedrails?: A Little Help needed moving from lying on your back to sitting on the side of a flat bed without using bedrails?: A Little Help needed moving to and from a bed to a chair (including a wheelchair)?: A Little Help needed standing up from a chair using your arms (e.g., wheelchair or bedside chair)?: A Little Help needed to walk in hospital room?: A Little Help needed climbing 3-5 steps with a railing? : A Lot 6 Click Score: 17    End of Session Equipment Utilized During Treatment: Gait belt;Cervical collar Activity Tolerance: Patient tolerated treatment well Patient left: in chair;with call bell/phone within reach Nurse Communication: Mobility status PT Visit Diagnosis: Unsteadiness on feet (R26.81);Repeated falls (R29.6);Muscle weakness (generalized) (M62.81);History of falling (Z91.81);Other abnormalities of gait and mobility (R26.89)     Time: 7121-9758 PT Time Calculation (min) (ACUTE ONLY): 21 min  Charges:  $Gait Training: 8-22 mins                     Leighton Roach, [place]  Pager [phone] Office [place] 02/04/2021, 2:02 PM

## 2021-02-04 NOTE — Progress Notes (Signed)
Patient refused all his HS PO medications stating that he is having difficulty swallowing. All efforts to educate patient on the importance of taking his PO medications proved abortive. Patient right now wants only morphine IVPush 4mg  only. Will continue monitoring.

## 2021-02-04 NOTE — Evaluation (Signed)
Occupational Therapy Treatment Patient Details Name: Johnny Casey MRN: [id number] DOB: 1950/11/18 Today's Date: 02/04/2021   History of present illness 70 yo male s/p C3-5 ACDF on 9/23. Pt with recent falls. Imaging of R shoulder showing no acute changes. PMH including anxiety, arthritis, cancer, BPH, COPD, DM, hepatitis, HTN, prior back sx, and prior C6-7 ACDF.   OT comments  Pt with progress towards goals this session. Pt performed functional mobility to bathroom with min guard and use of RW, requiring seated rest break upon arrival to bathroom due to fatigue. Pt educated on UB dressing and collar management sitting at sink with Min A. Pt requiring min verbal cues throughout to adhere to cervical precautions and recalled 2/4 from previous session. Pt given handout with compensatory strategies for ADL and functional mobility to adhere to cervical precautions. Pt with continued unsteadiness, decreased functional strength, activity tolerance, and decreased safety/awareness of precautions. Despite fatigue, highly  motivated to participate in therapy. Continuing to recommend CIR to optimize safety and address performance problems. Will continue to follow in the acute setting.    Recommendations for follow up therapy are one component of a multi-disciplinary discharge planning process, led by the attending physician.  Recommendations may be updated based on patient status, additional functional criteria and insurance authorization.    Follow Up Recommendations  CIR    Equipment Recommendations  None recommended by OT    Recommendations for Other Services      Precautions / Restrictions Precautions Precautions: Cervical Precaution Booklet Issued: Yes Precaution Comments: Cervical collar can be removed when toileting, showering, and in bed. Required Braces or Orthoses: Cervical Brace Cervical Brace: Hard collar Restrictions Weight Bearing Restrictions: No       Mobility Bed Mobility                General bed mobility comments: OOB upon arrival    Transfers Overall transfer level: Needs assistance Equipment used: Rolling walker (2 wheeled) Transfers: Sit to/from Stand Sit to Stand: Min guard         General transfer comment: Min guard for safety    Balance Overall balance assessment: History of Falls;Needs assistance Sitting-balance support: Feet supported;No upper extremity supported Sitting balance-Leahy Scale: Good Sitting balance - Comments: Difficult to assess due to UE support in recliner   Standing balance support: Bilateral upper extremity supported Standing balance-Leahy Scale: Poor Standing balance comment: Functional mobilty to bathroom to sitting at sink, requiring RW for support throughout.                           ADL either performed or assessed with clinical judgement   ADL Overall ADL's : Needs assistance/impaired                 Upper Body Dressing : Min A;Cueing for compensatory techniques;Cueing for safety;Sitting Upper Body Dressing Details (indicate cue type and reason): Pt educated on modified UB dressing techniques and cervical collar management and donning/doffing. Pt needing min verbal cues throughout for safety, able to donn shirt and cervical collar with Min A for adjusting shirt to place over head initially. Wife present and educated on techniques.                 Functional mobility during ADLs: Min guard;Cueing for safety;Rolling walker General ADL Comments: Pt reviewed precautions verbally with recall of 2/4 precautions from previous session. Pt requiring min guard for functional mobility and educated on correct use of RW.  Vision  Wears glasses     Perception     Praxis      Cognition Arousal/Alertness: Awake/alert Behavior During Therapy: WFL for tasks assessed/performed Overall Cognitive Status: Within Functional Limits for tasks assessed                                  General Comments: Pt able to follow multistep commands.        Exercises     Shoulder Instructions       General Comments  Wife present throughout, educated on collar management, modified dressing techniques.     Pertinent Vitals/ Pain       Pain Assessment: Faces Faces Pain Scale: No hurt Pain Location: Neck Pain Intervention(s): Monitored during session  Home Living Family/patient expects to be discharged to:: Private residence Living Arrangements: Spouse/significant other Available Help at Discharge: Family Type of Home: House Home Access: Stairs to enter Technical brewer of Steps: 1   Home Layout: One level     Bathroom Shower/Tub: Teacher, early years/pre: Standard                Prior Functioning/Environment              Frequency  Min 2X/week        Progress Toward Goals  OT Goals(current goals can now be found in the care plan section)  Progress towards OT goals: Progressing toward goals  Acute Rehab OT Goals Patient Stated Goal: to improve endurance, strength, and balance. OT Goal Formulation: With patient Time For Goal Achievement: 02/15/21 Potential to Achieve Goals: Good ADL Goals Pt Will Perform Grooming: with min guard assist;standing Pt Will Perform Upper Body Dressing: with supervision;sitting Pt Will Perform Lower Body Dressing: with supervision;sit to/from stand Pt Will Transfer to Toilet: with min guard assist;regular height toilet Pt Will Perform Toileting - Clothing Manipulation and hygiene: sit to/from stand;with supervision Pt Will Perform Tub/Shower Transfer: Tub transfer  Plan Discharge plan remains appropriate    Co-evaluation                 AM-PAC OT "6 Clicks" Daily Activity     Outcome Measure   Help from another person eating meals?: A Little Help from another person taking care of personal grooming?: A Little Help from another person toileting, which includes using toliet, bedpan,  or urinal?: A Little Help from another person bathing (including washing, rinsing, drying)?: A Little Help from another person to put on and taking off regular upper body clothing?: A Little Help from another person to put on and taking off regular lower body clothing?: A Little 6 Click Score: 18    End of Session Equipment Utilized During Treatment: Gait belt;Cervical collar  OT Visit Diagnosis: Unsteadiness on feet (R26.81);Repeated falls (R29.6);Muscle weakness (generalized) (M62.81);History of falling (Z91.81);Pain Pain - part of body:  (Neck)   Activity Tolerance Patient tolerated treatment well   Patient Left in chair;with chair alarm set   Nurse Communication Mobility status        Time: [phone] OT Time Calculation (min): 23 min  Charges: OT General Charges $OT Visit: 1 Visit OT Treatments $Self Care/Home Management : 23-37 mins  [name], OTS Acute Rehab Office: [phone]   Lemoine Goyne 02/04/2021, 9:37 AM

## 2021-02-05 LAB — GLUCOSE, CAPILLARY
Glucose-Capillary: 147 mg/dL — ABNORMAL HIGH (ref 70–99)
Glucose-Capillary: 118 mg/dL — ABNORMAL HIGH (ref 70–99)
Glucose-Capillary: 143 mg/dL — ABNORMAL HIGH (ref 70–99)
Glucose-Capillary: 143 mg/dL — ABNORMAL HIGH (ref 70–99)

## 2021-02-05 NOTE — Progress Notes (Signed)
Neurosurgery called back,  Dr. Zada Finders was made aware of patient refusing all his PO meds, and the elevateted  SBP of 190/100, and a request for PRN elevated blood pressure. Per Dr. Zada Finders, " The nurse should tell the patient to take his meds, especially metoprolol. No new orders received as at the time of this documentation.

## 2021-02-05 NOTE — Progress Notes (Signed)
Physical Therapy Treatment Patient Details Name: Johnny Casey MRN: [id number] DOB: [birth year] Today's Date: 02/05/2021   History of Present Illness 71 yo male s/p C3-5 ACDF on 9/23. Pt with recent falls. Imaging of R shoulder showing no acute changes. PMH including anxiety, arthritis, cancer, BPH, COPD, DM, hepatitis, HTN, prior back sx, and prior C6-7 ACDF.    PT Comments    Pt's ambulation limited today by SOB with HR to 118-120bpm with 3 mins rest needed each 60' for breathing to slow and HR to return <100 bpm. Pt needed cues for keeping precautions when groggy from napping. He performed sit<>stand 5x in 48 secs with progressive slowing with reps. Continue to recommend CIR at d/c for return to independence. PT will continue to follow.    Recommendations for follow up therapy are one component of a multi-disciplinary discharge planning process, led by the attending physician.  Recommendations may be updated based on patient status, additional functional criteria and insurance authorization.  Follow Up Recommendations  CIR     Equipment Recommendations  Rolling walker with 5" wheels    Recommendations for Other Services Rehab consult     Precautions / Restrictions Precautions Precautions: Cervical Precaution Booklet Issued: No Precaution Comments: Cervical collar can be removed when toileting, showering, and in bed. Required Braces or Orthoses: Cervical Brace Cervical Brace: Hard collar Restrictions Weight Bearing Restrictions: No     Mobility  Bed Mobility Overal bed mobility: Needs Assistance Bed Mobility: Rolling;Sidelying to Sit Rolling: Supervision Sidelying to sit: Min assist       General bed mobility comments: min HHA needed to shift wt from SL to sit    Transfers Overall transfer level: Needs assistance Equipment used: Rolling walker (2 wheeled) Transfers: Sit to/from Stand Sit to Stand: Min guard Stand pivot transfers: Min assist;+2 safety/equipment        General transfer comment: pt performed sit<>stand 5x in 48 secs, close guarding needed  Ambulation/Gait Ambulation/Gait assistance: Min assist Gait Distance (Feet): 60 Feet (60', then 40', then 40') Assistive device: Rolling walker (2 wheeled) Gait Pattern/deviations: Step-through pattern;Narrow base of support Gait velocity: decreased Gait velocity interpretation: <1.8 ft/sec, indicate of risk for recurrent falls General Gait Details: pt's ambulation limited by SOB and HR 120 bpm. Pt required 3 min rest break to recover with HR<100 before being able to continue ambulation.   Stairs             Wheelchair Mobility    Modified Rankin (Stroke Patients Only)       Balance Overall balance assessment: History of Falls;Needs assistance Sitting-balance support: Feet supported;No upper extremity supported Sitting balance-Leahy Scale: Good Sitting balance - Comments: maintain static sitting EOB with safety, needs min-guard A with challenges   Standing balance support: Bilateral upper extremity supported Standing balance-Leahy Scale: Poor Standing balance comment: reliant on UE support                            Cognition Arousal/Alertness: Awake/alert Behavior During Therapy: WFL for tasks assessed/performed Overall Cognitive Status: Within Functional Limits for tasks assessed                                 General Comments: woke pt from nap today and he began to sit straight up in bed, cued for precautions. Could verbalize once fully awake      Exercises Other Exercises Other Exercises:  shoulder shrugs x10 Other Exercises: scapular retraction x10 ( minimal mvmt of B scapulae noted)    General Comments        Pertinent Vitals/Pain Pain Assessment: Faces Faces Pain Scale: Hurts little more Pain Location: Neck Pain Descriptors / Indicators: Discomfort;Grimacing Pain Intervention(s): Limited activity within patient's  tolerance;Monitored during session    Home Living                      Prior Function            PT Goals (current goals can now be found in the care plan section) Acute Rehab PT Goals Patient Stated Goal: to improve endurance, strength, and balance. PT Goal Formulation: With patient Time For Goal Achievement: 02/16/21 Potential to Achieve Goals: Fair Progress towards PT goals: Progressing toward goals    Frequency    Min 5X/week      PT Plan Current plan remains appropriate    Co-evaluation PT/OT/SLP Co-Evaluation/Treatment: Yes            AM-PAC PT "6 Clicks" Mobility   Outcome Measure  Help needed turning from your back to your side while in a flat bed without using bedrails?: A Little Help needed moving from lying on your back to sitting on the side of a flat bed without using bedrails?: A Little Help needed moving to and from a bed to a chair (including a wheelchair)?: A Little Help needed standing up from a chair using your arms (e.g., wheelchair or bedside chair)?: A Little Help needed to walk in hospital room?: A Little Help needed climbing 3-5 steps with a railing? : A Lot 6 Click Score: 17    End of Session Equipment Utilized During Treatment: Gait belt;Cervical collar Activity Tolerance: Patient tolerated treatment well Patient left: in chair;with call bell/phone within reach Nurse Communication: Mobility status PT Visit Diagnosis: Unsteadiness on feet (R26.81);Repeated falls (R29.6);Muscle weakness (generalized) (M62.81);History of falling (Z91.81);Other abnormalities of gait and mobility (R26.89)     Time: 1041-1106 PT Time Calculation (min) (ACUTE ONLY): 25 min  Charges:  $Gait Training: 23-37 mins                     Leighton Roach, [place]  Pager [phone] Office [place] 02/05/2021, 4:01 PM

## 2021-02-05 NOTE — Progress Notes (Signed)
SBP=190/100. No PRN. Neurosurgery notified/ Awaiting call back from MD.

## 2021-02-05 NOTE — Progress Notes (Addendum)
Inpatient Rehab Admissions Coordinator:   Attempted to reach emergency contact, Sherin Quarry, but no answer and unable to leave voicemail.  Will try again this afternoon.   1308: Number for Hilda Blades appears to have been disconnected on my second and third attempts.   Shann Medal, PT, DPT Admissions Coordinator [phone] 02/05/21  10:09 AM

## 2021-02-05 NOTE — Progress Notes (Signed)
  NEUROSURGERY PROGRESS NOTE   No issues overnight. Reports ability to swallow medications "his way," able to tolerate diet.   EXAM:  BP (!) 189/99 (BP Location: Right Arm)   Pulse 96   Temp (!) 97.5 F (36.4 C) (Oral)   Resp 20   Ht 5\' 10"  (1.778 m)   Wt 73.5 kg   SpO2 94%   BMI 23.24 kg/m   Awake, alert, oriented  Speech fluent, appropriate  CN grossly intact  5/5 BUE/BLE x stable RUE weakness  IMPRESSION:  70 y.o. 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 At baseline  PLAN: - Cont therapy - Aspen collar when OOB, ok without collar in bed - d/c IV pain medicine, transition to PO only - Stable for CIR if/when bed available   Consuella Lose, MD [facility]

## 2021-02-06 ENCOUNTER — Encounter (HOSPITAL_COMMUNITY): Payer: Self-pay | Admitting: Neurosurgery

## 2021-02-06 DIAGNOSIS — N400 Enlarged prostate without lower urinary tract symptoms: Secondary | ICD-10-CM | POA: Diagnosis present

## 2021-02-06 DIAGNOSIS — K219 Gastro-esophageal reflux disease without esophagitis: Secondary | ICD-10-CM | POA: Diagnosis present

## 2021-02-06 DIAGNOSIS — E1122 Type 2 diabetes mellitus with diabetic chronic kidney disease: Secondary | ICD-10-CM | POA: Diagnosis present

## 2021-02-06 DIAGNOSIS — Z85820 Personal history of malignant melanoma of skin: Secondary | ICD-10-CM | POA: Diagnosis not present

## 2021-02-06 DIAGNOSIS — I129 Hypertensive chronic kidney disease with stage 1 through stage 4 chronic kidney disease, or unspecified chronic kidney disease: Secondary | ICD-10-CM | POA: Diagnosis present

## 2021-02-06 DIAGNOSIS — M1990 Unspecified osteoarthritis, unspecified site: Secondary | ICD-10-CM | POA: Diagnosis present

## 2021-02-06 DIAGNOSIS — K760 Fatty (change of) liver, not elsewhere classified: Secondary | ICD-10-CM | POA: Diagnosis present

## 2021-02-06 DIAGNOSIS — F17210 Nicotine dependence, cigarettes, uncomplicated: Secondary | ICD-10-CM | POA: Diagnosis present

## 2021-02-06 DIAGNOSIS — Z794 Long term (current) use of insulin: Secondary | ICD-10-CM | POA: Diagnosis not present

## 2021-02-06 DIAGNOSIS — N189 Chronic kidney disease, unspecified: Secondary | ICD-10-CM | POA: Diagnosis present

## 2021-02-06 DIAGNOSIS — M542 Cervicalgia: Secondary | ICD-10-CM | POA: Diagnosis present

## 2021-02-06 DIAGNOSIS — G4730 Sleep apnea, unspecified: Secondary | ICD-10-CM | POA: Diagnosis present

## 2021-02-06 DIAGNOSIS — Z9181 History of falling: Secondary | ICD-10-CM | POA: Diagnosis not present

## 2021-02-06 DIAGNOSIS — M4802 Spinal stenosis, cervical region: Secondary | ICD-10-CM | POA: Diagnosis present

## 2021-02-06 DIAGNOSIS — Z7982 Long term (current) use of aspirin: Secondary | ICD-10-CM | POA: Diagnosis not present

## 2021-02-06 DIAGNOSIS — G992 Myelopathy in diseases classified elsewhere: Secondary | ICD-10-CM | POA: Diagnosis present

## 2021-02-06 DIAGNOSIS — F419 Anxiety disorder, unspecified: Secondary | ICD-10-CM | POA: Diagnosis present

## 2021-02-06 DIAGNOSIS — J449 Chronic obstructive pulmonary disease, unspecified: Secondary | ICD-10-CM | POA: Diagnosis present

## 2021-02-06 DIAGNOSIS — Z8711 Personal history of peptic ulcer disease: Secondary | ICD-10-CM | POA: Diagnosis not present

## 2021-02-06 LAB — GLUCOSE, CAPILLARY
Glucose-Capillary: 155 mg/dL — ABNORMAL HIGH (ref 70–99)
Glucose-Capillary: 150 mg/dL — ABNORMAL HIGH (ref 70–99)
Glucose-Capillary: 155 mg/dL — ABNORMAL HIGH (ref 70–99)
Glucose-Capillary: 120 mg/dL — ABNORMAL HIGH (ref 70–99)

## 2021-02-06 NOTE — Progress Notes (Signed)
  NEUROSURGERY PROGRESS NOTE   No issues overnight. Reports ability to swallow medications "his way," able to tolerate diet.   EXAM:  BP (!) 158/94 (BP Location: Left Arm)   Pulse 90   Temp 97.8 F (36.6 C) (Oral)   Resp 19   Ht 5\' 10"  (1.778 m)   Wt 73.5 kg   SpO2 97%   BMI 23.24 kg/m   Awake, alert, oriented  Speech fluent, appropriate  CN grossly intact  5/5 BUE/BLE x stable RUE weakness  IMPRESSION:  70 y.o. 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 At baseline  PLAN: - Cont therapy - Cont to plan on CIR when insurance approves/bed available   Consuella Lose, MD [facility]

## 2021-02-06 NOTE — Progress Notes (Addendum)
Inpatient Rehab Admissions Coordinator:   Attempted to reach Johnny Casey again x2.  Number disconnected when I call from my cell, and no answer/no voicemail when I call from my desk.   1535: Pt reports Johnny Casey is his ex-wife and provided new information for his current wife.  I've left [name] a message and updated his contact information in Epic.   Shann Medal, PT, DPT Admissions Coordinator [phone] 02/06/21  3:17 PM

## 2021-02-06 NOTE — Plan of Care (Signed)

## 2021-02-06 NOTE — Progress Notes (Signed)
Physical Therapy Treatment Patient Details Name: Johnny Casey MRN: [id number] DOB: May 07, 1951 Today's Date: 02/06/2021   History of Present Illness 70 yo male s/p C3-5 ACDF on 9/23. Pt with recent falls. Imaging of R shoulder showing no acute changes. PMH including anxiety, arthritis, cancer, BPH, COPD, DM, hepatitis, HTN, prior back sx, and prior C6-7 ACDF.    PT Comments    Patient with slow progress due to LE weakness and pain which seems worse today.  He continues to report neuropathy causing LE's to give way needing seated rest.  Able to walk with +1 A still with chair following.  Patient remains appropriate for CIR level rehab to improve activity tolerance, balance, LE strength endurance and safety.     Recommendations for follow up therapy are one component of a multi-disciplinary discharge planning process, led by the attending physician.  Recommendations may be updated based on patient status, additional functional criteria and insurance authorization.  Follow Up Recommendations  CIR     Equipment Recommendations  Rolling walker with 5" wheels    Recommendations for Other Services       Precautions / Restrictions Precautions Precautions: Cervical;Fall Precaution Comments: Cervical collar can be removed when toileting, showering, and in bed. Required Braces or Orthoses: Cervical Brace Cervical Brace: Hard collar     Mobility  Bed Mobility Overal bed mobility: Needs Assistance Bed Mobility: Rolling;Sidelying to Sit Rolling: Supervision Sidelying to sit: Min assist       General bed mobility comments: min HHA needed to shift wt from SL to sit    Transfers Overall transfer level: Needs assistance Equipment used: Rolling walker (2 wheeled) Transfers: Sit to/from Stand Sit to Stand: Min assist         General transfer comment: A more at beginning of session to stand from EOB, improved with more mobility third time with  minguard  Ambulation/Gait Ambulation/Gait assistance: Min assist Gait Distance (Feet): 60 Feet (& 40' x 2) Assistive device: Rolling walker (2 wheeled) Gait Pattern/deviations: Step-through pattern;Narrow base of support     General Gait Details: cues for increased BOS as risk of scissoring, HR 113 and SpO2 94% with ambulation, two seated rests with LE weakness   Stairs             Wheelchair Mobility    Modified Rankin (Stroke Patients Only)       Balance Overall balance assessment: Needs assistance   Sitting balance-Leahy Scale: Good     Standing balance support: Bilateral upper extremity supported Standing balance-Leahy Scale: Poor Standing balance comment: reliant on UE support                            Cognition Arousal/Alertness: Awake/alert Behavior During Therapy: WFL for tasks assessed/performed Overall Cognitive Status: Within Functional Limits for tasks assessed                                        Exercises      General Comments        Pertinent Vitals/Pain Pain Assessment: Faces Pain Score: 7  Pain Location: Neck Pain Descriptors / Indicators: Aching;Sore Pain Intervention(s): Monitored during session;Repositioned    Home Living                      Prior Function  PT Goals (current goals can now be found in the care plan section) Progress towards PT goals: Progressing toward goals    Frequency    Min 5X/week      PT Plan Current plan remains appropriate    Co-evaluation              AM-PAC PT "6 Clicks" Mobility   Outcome Measure  Help needed turning from your back to your side while in a flat bed without using bedrails?: A Little Help needed moving from lying on your back to sitting on the side of a flat bed without using bedrails?: A Little Help needed moving to and from a bed to a chair (including a wheelchair)?: A Little Help needed standing up from a chair  using your arms (e.g., wheelchair or bedside chair)?: A Little Help needed to walk in hospital room?: A Little Help needed climbing 3-5 steps with a railing? : A Lot 6 Click Score: 17    End of Session Equipment Utilized During Treatment: Gait belt;Cervical collar Activity Tolerance: Patient limited by fatigue Patient left: in chair;with call bell/phone within reach;with chair alarm set   PT Visit Diagnosis: Unsteadiness on feet (R26.81);Repeated falls (R29.6);Muscle weakness (generalized) (M62.81);History of falling (Z91.81);Other abnormalities of gait and mobility (R26.89)     Time: 4008-6761 PT Time Calculation (min) (ACUTE ONLY): 16 min  Charges:  $Gait Training: 8-22 mins                     Magda Kiel, PT Acute Rehabilitation Services [phone] Office:[phone] 02/06/2021    [name] 02/06/2021, 5:38 PM

## 2021-02-07 LAB — GLUCOSE, CAPILLARY
Glucose-Capillary: 177 mg/dL — ABNORMAL HIGH (ref 70–99)
Glucose-Capillary: 79 mg/dL (ref 70–99)
Glucose-Capillary: 138 mg/dL — ABNORMAL HIGH (ref 70–99)
Glucose-Capillary: 102 mg/dL — ABNORMAL HIGH (ref 70–99)

## 2021-02-07 NOTE — Progress Notes (Signed)
  NEUROSURGERY PROGRESS NOTE   No issues overnight. No complaints this evening.  EXAM:  BP 128/79 (BP Location: Right Arm)   Pulse 96   Temp 98.1 F (36.7 C) (Oral)   Resp 18   Ht 5\' 10"  (1.778 m)   Wt 73.5 kg   SpO2 95%   BMI 23.24 kg/m   Awake, alert, oriented  Speech fluent, appropriate  CN grossly intact  5/5 BUE/BLE x stable RUE weakness  IMPRESSION:  70 y.o. 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 At baseline  PLAN: - Cont therapy - Cont to plan on CIR when insurance approves/bed available, hopefully this weekend.   Consuella Lose, MD [facility]

## 2021-02-07 NOTE — Plan of Care (Signed)

## 2021-02-07 NOTE — Progress Notes (Signed)
Inpatient Rehab Admissions Coordinator:   Spoke to [place].  Will start insurance auth today for possible admission over the weekend, pending approval and bed availability.   Shann Medal, PT, DPT Admissions Coordinator [phone] 02/07/21  12:40 PM

## 2021-02-07 NOTE — Progress Notes (Signed)
Occupational Therapy Treatment Patient Details Name: Johnny Casey MRN: [id number] DOB: 1950/05/22 Today's Date: 02/07/2021   History of present illness 70 yo male s/p C3-5 ACDF on 9/23. Pt with recent falls. Imaging of R shoulder showing no acute changes. PMH including anxiety, arthritis, cancer, BPH, COPD, DM, hepatitis, HTN, prior back sx, and prior C6-7 ACDF.   OT comments  Pt progressing towards acute OT goals. Pt completed bathroom distance mobility. Attempted 1 grooming task in standing with pt becoming lightheaded and needed seated rest break. Discussed AAROM/PROM exercises for RUE to toleration. D/c plan remains appropriate.    Recommendations for follow up therapy are one component of a multi-disciplinary discharge planning process, led by the attending physician.  Recommendations may be updated based on patient status, additional functional criteria and insurance authorization.    Follow Up Recommendations  CIR    Equipment Recommendations  None recommended by OT    Recommendations for Other Services      Precautions / Restrictions Precautions Precautions: Cervical;Fall Precaution Booklet Issued: No Precaution Comments: Cervical collar can be removed when toileting, showering, and in bed. Required Braces or Orthoses: Cervical Brace Cervical Brace: Hard collar Restrictions Weight Bearing Restrictions: No       Mobility Bed Mobility Overal bed mobility: Needs Assistance Bed Mobility: Rolling;Sidelying to Sit Rolling: Supervision Sidelying to sit: Min assist       General bed mobility comments: struggles a bit with sidelying to sit    Transfers Overall transfer level: Needs assistance Equipment used: Rolling walker (2 wheeled) Transfers: Sit to/from Stand Sit to Stand: Min assist;Mod assist         General transfer comment: min A to steady, mod A to control descent into chair when pt became lightheaded standing at sink.    Balance Overall balance  assessment: Needs assistance Sitting-balance support: Feet supported;No upper extremity supported Sitting balance-Leahy Scale: Good     Standing balance support: Bilateral upper extremity supported Standing balance-Leahy Scale: Poor Standing balance comment: reliant on UE support                           ADL either performed or assessed with clinical judgement   ADL Overall ADL's : Needs assistance/impaired     Grooming: Moderate assistance;Maximal assistance;Standing Grooming Details (indicate cue type and reason): for balance. pt became lightheaded standing at sink needed chair brought to pt                 Toilet Transfer: Minimal assistance             General ADL Comments: Pt complted bed mobility, walked bathrom distance, attempted to stand at sink but pt became lightheaded and needed chair brought to sit down; mod A to control descent     Vision       Perception     Praxis      Cognition Arousal/Alertness: Awake/alert Behavior During Therapy: WFL for tasks assessed/performed Overall Cognitive Status: Within Functional Limits for tasks assessed                                          Exercises     Shoulder Instructions       General Comments Pt noted to don brace upside down, assist from OT to reposition, educated on alignment and positioning    Pertinent Vitals/ Pain  Pain Assessment: Faces Faces Pain Scale: Hurts little more Pain Location: Neck Pain Descriptors / Indicators: Aching;Sore Pain Intervention(s): Monitored during session;Limited activity within patient's tolerance;Repositioned  Home Living                                          Prior Functioning/Environment              Frequency  Min 2X/week        Progress Toward Goals  OT Goals(current goals can now be found in the care plan section)  Progress towards OT goals: Progressing toward goals  Acute Rehab OT  Goals Patient Stated Goal: to improve endurance, strength, and balance. OT Goal Formulation: With patient Time For Goal Achievement: 02/15/21 Potential to Achieve Goals: Good ADL Goals Pt Will Perform Grooming: with min guard assist;standing Pt Will Perform Upper Body Dressing: with supervision;sitting Pt Will Perform Lower Body Dressing: with supervision;sit to/from stand Pt Will Transfer to Toilet: with min guard assist;regular height toilet Pt Will Perform Toileting - Clothing Manipulation and hygiene: sit to/from stand;with supervision Pt Will Perform Tub/Shower Transfer: Tub transfer  Plan Discharge plan remains appropriate    Co-evaluation                 AM-PAC OT "6 Clicks" Daily Activity     Outcome Measure   Help from another person eating meals?: A Little Help from another person taking care of personal grooming?: A Little Help from another person toileting, which includes using toliet, bedpan, or urinal?: A Little Help from another person bathing (including washing, rinsing, drying)?: A Little Help from another person to put on and taking off regular upper body clothing?: A Little Help from another person to put on and taking off regular lower body clothing?: A Little 6 Click Score: 18    End of Session Equipment Utilized During Treatment: Rolling walker;Cervical collar  OT Visit Diagnosis: Unsteadiness on feet (R26.81);Repeated falls (R29.6);Muscle weakness (generalized) (M62.81);History of falling (Z91.81);Pain   Activity Tolerance Other (comment) (lightheaded standing at sink, needed few minutes seated rest break then up to recliner)   Patient Left in chair;with chair alarm set   Nurse Communication          Time: 0076-2263 OT Time Calculation (min): 25 min  Charges: OT General Charges $OT Visit: 1 Visit OT Treatments $Self Care/Home Management : 23-37 mins  Tyrone Schimke, OT Acute Rehabilitation Services Pager: [phone] Office:  [phone]   Hortencia Pilar 02/07/2021, 11:22 AM

## 2021-02-07 NOTE — Progress Notes (Signed)
Inpatient Rehab Admissions Coordinator:   Received callback from [place] last night.  Left her another voicemail this morning and awaiting her return call.   Shann Medal, PT, DPT Admissions Coordinator [phone] 02/07/21  11:48 AM

## 2021-02-07 NOTE — Progress Notes (Addendum)
Physical Therapy Treatment Patient Details Name: Johnny Casey MRN: [id number] DOB: [birth year] Today's Date: 02/07/2021   History of Present Illness 70 yo male s/p C3-5 ACDF on 9/23. Pt with recent falls. Imaging of R shoulder showing no acute changes. PMH including anxiety, arthritis, cancer, BPH, COPD, DM, hepatitis, HTN, prior back sx, and prior C6-7 ACDF.    PT Comments    Patient with limited progress due to poor tolerance to upright at times feeling shaky and "woozy" like he could pass out.  Attempted orthostatics, but BP seated was 198/122 and standing 150/90 with NT reporting difficulty getting a "good reading".  Patient reports multiple issues passing out without warning.  Has tried numerous techniques to help over the past 2 years.  States sometimes it helps and he feels stronger, but then comes back.  Feel pt hopeful for continues improvement on inpatient rehab.  PT to continue to follow.    Recommendations for follow up therapy are one component of a multi-disciplinary discharge planning process, led by the attending physician.  Recommendations may be updated based on patient status, additional functional criteria and insurance authorization.  Follow Up Recommendations  CIR     Equipment Recommendations  Rolling walker with 5" wheels    Recommendations for Other Services       Precautions / Restrictions Precautions Precautions: Fall;Cervical Precaution Comments: Cervical collar can be removed when toileting, showering, and in bed. Required Braces or Orthoses: Cervical Brace Cervical Brace: Hard collar     Mobility  Bed Mobility Overal bed mobility: Needs Assistance   Rolling: Supervision Sidelying to sit: Min assist       General bed mobility comments: UE use for side to sit limited at times due to pain    Transfers Overall transfer level: Needs assistance Equipment used: Rolling walker (2 wheeled) Transfers: Sit to/from Stand Sit to Stand: Min assist          General transfer comment: assist for balance and safety, several performed for LE strength and due to feeling weak/shaky during ambulation  Ambulation/Gait Ambulation/Gait assistance: Min assist Gait Distance (Feet): 50 Feet (x 2 & 40'.) Assistive device: Rolling walker (2 wheeled) Gait Pattern/deviations: Step-through pattern;Narrow base of support     General Gait Details: cues at times for increased BSC; needed 2 seated rest breaks due to weakness   Stairs             Wheelchair Mobility    Modified Rankin (Stroke Patients Only)       Balance Overall balance assessment: Needs assistance   Sitting balance-Leahy Scale: Good     Standing balance support: Bilateral upper extremity supported Standing balance-Leahy Scale: Poor Standing balance comment: reliant on UE support                            Cognition Arousal/Alertness: Awake/alert Behavior During Therapy: WFL for tasks assessed/performed Overall Cognitive Status: Within Functional Limits for tasks assessed                                        Exercises Other Exercises Other Exercises: sit<>stand x 5 from EOB min A cues for slowly lowering for LE strengthening    General Comments General comments (skin integrity, edema, etc.): BP measuring 198/122 seated, 150/90 standing, NT aware and reports has been difficult to get good reading, used smaller cuff  Pertinent Vitals/Pain Faces Pain Scale: Hurts little more Pain Location: Neck Pain Descriptors / Indicators: Sore Pain Intervention(s): Monitored during session    Home Living                      Prior Function            PT Goals (current goals can now be found in the care plan section) Progress towards PT goals: Progressing toward goals    Frequency    Min 5X/week      PT Plan Current plan remains appropriate    Co-evaluation              AM-PAC PT "6 Clicks" Mobility    Outcome Measure  Help needed turning from your back to your side while in a flat bed without using bedrails?: None Help needed moving from lying on your back to sitting on the side of a flat bed without using bedrails?: A Little Help needed moving to and from a bed to a chair (including a wheelchair)?: A Little Help needed standing up from a chair using your arms (e.g., wheelchair or bedside chair)?: A Little Help needed to walk in hospital room?: A Little Help needed climbing 3-5 steps with a railing? : A Lot 6 Click Score: 18    End of Session Equipment Utilized During Treatment: Gait belt;Cervical collar Activity Tolerance: Patient limited by fatigue Patient left: in bed;with call bell/phone within reach;with bed alarm set   PT Visit Diagnosis: Repeated falls (R29.6);Muscle weakness (generalized) (M62.81);Other abnormalities of gait and mobility (R26.89)     Time: 7290-2111 PT Time Calculation (min) (ACUTE ONLY): 28 min  Charges:  $Gait Training: 8-22 mins $Therapeutic Exercise: 8-22 mins                     .cyu    [name] 02/07/2021, 6:22 PM

## 2021-02-08 DIAGNOSIS — E1122 Type 2 diabetes mellitus with diabetic chronic kidney disease: Secondary | ICD-10-CM | POA: Diagnosis not present

## 2021-02-08 DIAGNOSIS — K760 Fatty (change of) liver, not elsewhere classified: Secondary | ICD-10-CM | POA: Diagnosis not present

## 2021-02-08 DIAGNOSIS — M4802 Spinal stenosis, cervical region: Secondary | ICD-10-CM | POA: Diagnosis not present

## 2021-02-08 DIAGNOSIS — G992 Myelopathy in diseases classified elsewhere: Secondary | ICD-10-CM | POA: Diagnosis not present

## 2021-02-08 LAB — CBC
HCT: 32.5 % — ABNORMAL LOW (ref 39.0–52.0)
Hemoglobin: 10.7 g/dL — ABNORMAL LOW (ref 13.0–17.0)
MCH: 29.2 pg (ref 26.0–34.0)
MCHC: 32.9 g/dL (ref 30.0–36.0)
MCV: 88.8 fL (ref 80.0–100.0)
Platelets: 276 10*3/uL (ref 150–400)
RBC: 3.66 MIL/uL — ABNORMAL LOW (ref 4.22–5.81)
RDW: 13.3 % (ref 11.5–15.5)
WBC: 6.9 10*3/uL (ref 4.0–10.5)
nRBC: 0 % (ref 0.0–0.2)

## 2021-02-08 LAB — GLUCOSE, CAPILLARY
Glucose-Capillary: 130 mg/dL — ABNORMAL HIGH (ref 70–99)
Glucose-Capillary: 108 mg/dL — ABNORMAL HIGH (ref 70–99)
Glucose-Capillary: 127 mg/dL — ABNORMAL HIGH (ref 70–99)
Glucose-Capillary: 92 mg/dL (ref 70–99)

## 2021-02-08 LAB — TROPONIN I (HIGH SENSITIVITY): Troponin I (High Sensitivity): 16 ng/L (ref <18)

## 2021-02-08 MED ORDER — LABETALOL HCL 5 MG/ML IV SOLN
10.0000 mg | INTRAVENOUS | Status: DC | PRN
  Administered 2021-02-08: 10 mg via INTRAVENOUS
  Filled 2021-02-08: qty 4

## 2021-02-08 MED ORDER — HYDROMORPHONE HCL 1 MG/ML IJ SOLN
1.0000 mg | INTRAMUSCULAR | Status: DC | PRN
  Administered 2021-02-08: 1 mg via INTRAVENOUS
  Filled 2021-02-08: qty 1

## 2021-02-08 NOTE — Progress Notes (Addendum)
While RN was in room checking VS, pt stated he "felt off" and that he "has never felt this way". Pt complaining of chest tightness, that it was hard to breath, and L shoulder pain. MD notified. See chart for VS. Pt placed on tele,12-lead EKG obtained, and new orders for troponin and CBC.

## 2021-02-08 NOTE — Progress Notes (Signed)
PT Cancellation Note  Patient Details Name: KWAMANE WHACK MRN: [id number] DOB: [birth year]   Cancelled Treatment:    Reason Eval/Treat Not Completed: Medical issues which prohibited therapy; fatigued after work up for cardiac issues.    [name] 02/08/2021, 5:12 PM Magda Kiel, PT Acute Rehabilitation Services Pager:[phone] Office:[phone] 02/08/2021

## 2021-02-08 NOTE — Progress Notes (Signed)
  NEUROSURGERY PROGRESS NOTE   No issues overnight. Began c/o some difficulty breathing this am. No report of chest pain or tightness currently. Was also c/o left shoulder pain which has resolved.  EXAM:  BP (!) 141/91   Pulse 91   Temp 97.6 F (36.4 C) (Oral)   Resp 18   Ht 5\' 10"  (1.778 m)   Wt 73.5 kg   SpO2 96%   BMI 23.24 kg/m   Awake, alert, oriented  Speech fluent, appropriate  CN grossly intact  5/5 BUE/BLE stable right shoulder/bicep weakness  IMPRESSION:  70 y.o. male POD#7 ACDF, neurologically well. C/o DIB without other symtpoms, vitals stable. 12-lead unremarkable. Does have hx of COPD  PLAN: - Will check CBC and Troponin - Cont to monitor - If stable with unremarkable labs and improvement can likely still tx to CIR tomorrow pending bed availability.   Consuella Lose, MD [facility]

## 2021-02-08 NOTE — Plan of Care (Signed)

## 2021-02-08 NOTE — TOC Initial Note (Signed)
Transition of Care [facility]) - Initial/Assessment Note    Patient Details  Name: Johnny Casey MRN: [id number] Date of Birth: [birth year]  Transition of Care [facility]) CM/SW Contact:    Verdell Carmine, RN Phone Number: 02/08/2021, 5:26 PM  Clinical Narrative:                 Patient from home with wife . This 70 year old was seen  by neurosurgery complaining of worsening gait instability and severe stenosis of C3-4 and C4-5 has surgical decompression on 9/23  PT and OT recommended IP rehab. CIR  is pending,bed availability..  Had some chest tightness , shortness of breath today and was seen by MD. EKG done. MD wrote can still go to CIR if bed available tomorrow    Expected Discharge Plan: [place] Barriers to Discharge: Continued Medical Work up   Patient Goals and CMS Choice     When bed available    Expected Discharge Plan and Services Expected Discharge Plan: [place] arrangements for the past 2 months: Single Family Home                                      Prior Living Arrangements/Services Living arrangements for the past 2 months: Single Family Home Lives with:: Spouse Patient language and need for interpreter reviewed:: Yes        Need for Family Participation in Patient Care: Yes (Comment) Care giver support system in place?: Yes (comment)   Criminal Activity/Legal Involvement Pertinent to Current Situation/Hospitalization: No - Comment as needed  Activities of Daily Living      Permission Sought/Granted                  Emotional Assessment       Orientation: : Oriented to Self, Oriented to Place, Oriented to  Time, Oriented to Situation Alcohol / Substance Use: Not Applicable Psych Involvement: No (comment)  Admission diagnosis:  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 [facility] [place] [address] Phone: [phone] Fax: [phone]     Social Determinants of Health (SDOH) Interventions    Readmission Risk Interventions No flowsheet data found.

## 2021-02-09 LAB — GLUCOSE, CAPILLARY
Glucose-Capillary: 148 mg/dL — ABNORMAL HIGH (ref 70–99)
Glucose-Capillary: 215 mg/dL — ABNORMAL HIGH (ref 70–99)

## 2021-02-09 MED ORDER — METHOCARBAMOL 500 MG PO TABS: 500.0000 mg | ORAL_TABLET | Freq: Four times a day (QID) | ORAL | 0 refills | Status: AC | PRN

## 2021-02-09 MED ORDER — OXYCODONE HCL 5 MG PO TABS: 5.0000 mg | ORAL_TABLET | ORAL | 0 refills | Status: AC | PRN

## 2021-02-09 NOTE — Discharge Summary (Signed)
Physician Discharge Summary  Patient ID: Johnny Casey MRN: [id number] DOB/AGE: 70/08/1950 70 y.o.  Admit date: 02/01/2021 Discharge date: 02/09/2021  Admission Diagnoses:  Discharge Diagnoses:  Active Problems:   Stenosis of cervical spine with myelopathy [facility])   Discharged Condition: good  Hospital Course: Patient admitted to the hospital where he underwent uncomplicated anterior cervical decompression and fusion surgery.  Postoperatively doing well.  Preoperative neck and upper extremity symptoms improved.  Ambulating and voiding without significant difficulty.  Patient feels ready for discharge home.  Consults:   Significant Diagnostic Studies:   Treatments:   Discharge Exam: Blood pressure (!) 148/93, pulse 91, temperature 97.8 F (36.6 C), temperature source Oral, resp. rate 15, height 5\' 10"  (1.778 m), weight 73.5 kg, SpO2 97 %. Awake and alert.  Oriented and appropriate.  Motor and sensory function stable.  Wound clean and dry.  Chest and abdomen benign.  Disposition: Discharge disposition: 01-Home or Self Care       Discharge Instructions     Incentive spirometry RT   Complete by: As directed       Allergies as of 02/09/2021       Reactions   Lactulose Diarrhea        Medication List     STOP taking these medications    cyclobenzaprine 10 MG tablet Commonly known as: FLEXERIL       TAKE these medications    albuterol 108 (90 Base) MCG/ACT inhaler Commonly known as: VENTOLIN HFA Inhale 2 puffs into the lungs every 6 (six) hours as needed for wheezing or shortness of breath.   aspirin EC 81 MG tablet Take 81 mg by mouth daily. Swallow whole.   busPIRone 15 MG tablet Commonly known as: BUSPAR Take 15 mg by mouth 2 (two) times daily.   Calcium Carb-Cholecalciferol 600-400 MG-UNIT Tabs Take 1 tablet by mouth in the morning and at bedtime.   cetirizine 10 MG tablet Commonly known as: ZYRTEC Take 10 mg by mouth in the morning.    Cholecalciferol 25 MCG (1000 UT) tablet Take 1,000 Units by mouth daily.   DULoxetine 60 MG capsule Commonly known as: CYMBALTA Take 60 mg by mouth 2 (two) times daily.   famotidine 20 MG tablet Commonly known as: PEPCID Take 20 mg by mouth at bedtime.   Fish Oil 500 MG Caps Take 1,000 mg by mouth in the morning and at bedtime.   insulin regular 100 units/mL injection Commonly known as: NOVOLIN R Inject 10 Units into the skin 2 (two) times daily as needed (blood sugar above 150).   lidocaine 5 % Commonly known as: LIDODERM Place 1 patch onto the skin daily as needed (pain). Remove & Discard patch within 12 hours or as directed by MD   linaclotide 145 MCG Caps capsule Commonly known as: LINZESS Take 145 mcg by mouth daily before breakfast.   Magnesium Oxide 420 MG Tabs Take 420 mg by mouth in the morning and at bedtime.   methocarbamol 500 MG tablet Commonly known as: ROBAXIN Take 1 tablet (500 mg total) by mouth every 6 (six) hours as needed for muscle spasms.   metoprolol tartrate 25 MG tablet Commonly known as: LOPRESSOR Take 12.5 mg by mouth at bedtime.   Oxcarbazepine 300 MG tablet Commonly known as: TRILEPTAL Take 300 mg by mouth in the morning and at bedtime.   oxyCODONE 5 MG immediate release tablet Commonly known as: Oxy IR/ROXICODONE Take 1 tablet (5 mg total) by mouth every 3 (three) hours as  needed for moderate pain ((score 4 to 6)).   pantoprazole 40 MG tablet Commonly known as: PROTONIX Take 40 mg by mouth in the morning.   pravastatin 10 MG tablet Commonly known as: PRAVACHOL Take 10 mg by mouth daily.   ranolazine 500 MG 12 hr tablet Commonly known as: RANEXA Take 500 mg by mouth in the morning and at bedtime.   rOPINIRole 0.25 MG tablet Commonly known as: REQUIP Take 0.25 mg by mouth at bedtime.   tamsulosin 0.4 MG Caps capsule Commonly known as: FLOMAX Take 0.8 mg by mouth at bedtime.         Signed: Cooper Render [name] 02/09/2021,  10:28 AM

## 2021-02-09 NOTE — Discharge Instructions (Signed)

## 2021-02-09 NOTE — Progress Notes (Signed)
Pt discharge education and instructions completed with pt. Pt voices understanding and denies any questions. Pt IV and telemetry removed. Pt neck incision remains dry and intact, unremarkable. Pt discharged home with spouse to transport him home. Pt awaiting on ride to transport him home. Pt to pick up electronically sent prescriptions from preferred pharmacy on file. Francis Gaines Lonetta Blassingame RN.

## 2021-02-25 ENCOUNTER — Emergency Department (HOSPITAL_COMMUNITY)
Admission: EM | Admit: 2021-02-25 | Discharge: 2021-02-25 | Disposition: A | Discharge status: Home / Self Care | Payer: No Typology Code available for payment source | Attending: Emergency Medicine | Admitting: Emergency Medicine

## 2021-02-25 ENCOUNTER — Emergency Department (HOSPITAL_COMMUNITY): Payer: No Typology Code available for payment source

## 2021-02-25 ENCOUNTER — Other Ambulatory Visit: Payer: Self-pay

## 2021-02-25 DIAGNOSIS — R55 Syncope and collapse: Secondary | ICD-10-CM | POA: Diagnosis not present

## 2021-02-25 DIAGNOSIS — S0990XA Unspecified injury of head, initial encounter: Secondary | ICD-10-CM | POA: Insufficient documentation

## 2021-02-25 DIAGNOSIS — R42 Dizziness and giddiness: Secondary | ICD-10-CM | POA: Insufficient documentation

## 2021-02-25 DIAGNOSIS — Z5321 Procedure and treatment not carried out due to patient leaving prior to being seen by health care provider: Secondary | ICD-10-CM | POA: Insufficient documentation

## 2021-02-25 DIAGNOSIS — W19XXXA Unspecified fall, initial encounter: Secondary | ICD-10-CM | POA: Diagnosis not present

## 2021-02-25 DIAGNOSIS — R531 Weakness: Secondary | ICD-10-CM | POA: Insufficient documentation

## 2021-02-25 LAB — CBC WITH DIFFERENTIAL/PLATELET
Abs Immature Granulocytes: 0.04 K/uL (ref 0.00–0.07)
Basophils Absolute: 0.1 K/uL (ref 0.0–0.1)
Basophils Relative: 1 %
Eosinophils Absolute: 0.2 K/uL (ref 0.0–0.5)
Eosinophils Relative: 2 %
HCT: 33.8 % — ABNORMAL LOW (ref 39.0–52.0)
Hemoglobin: 10.7 g/dL — ABNORMAL LOW (ref 13.0–17.0)
Immature Granulocytes: 0 %
Lymphocytes Relative: 8 %
Lymphs Abs: 0.9 K/uL (ref 0.7–4.0)
MCH: 28.4 pg (ref 26.0–34.0)
MCHC: 31.7 g/dL (ref 30.0–36.0)
MCV: 89.7 fL (ref 80.0–100.0)
Monocytes Absolute: 0.7 K/uL (ref 0.1–1.0)
Monocytes Relative: 6 %
Neutro Abs: 9.2 K/uL — ABNORMAL HIGH (ref 1.7–7.7)
Neutrophils Relative %: 83 %
Platelets: 354 K/uL (ref 150–400)
RBC: 3.77 MIL/uL — ABNORMAL LOW (ref 4.22–5.81)
RDW: 14.1 % (ref 11.5–15.5)
WBC: 11.1 K/uL — ABNORMAL HIGH (ref 4.0–10.5)
nRBC: 0 % (ref 0.0–0.2)

## 2021-02-25 LAB — COMPREHENSIVE METABOLIC PANEL
ALT: 11 U/L (ref 0–44)
AST: 16 U/L (ref 15–41)
Albumin: 3.4 g/dL — ABNORMAL LOW (ref 3.5–5.0)
Alkaline Phosphatase: 108 U/L (ref 38–126)
Anion gap: 11 (ref 5–15)
BUN: 21 mg/dL (ref 8–23)
CO2: 25 mmol/L (ref 22–32)
Calcium: 9.7 mg/dL (ref 8.9–10.3)
Chloride: 101 mmol/L (ref 98–111)
Creatinine, Ser: 2.18 mg/dL — ABNORMAL HIGH (ref 0.61–1.24)
GFR, Estimated: 32 mL/min — ABNORMAL LOW (ref >60)
Glucose, Bld: 204 mg/dL — ABNORMAL HIGH (ref 70–99)
Potassium: 4.4 mmol/L (ref 3.5–5.1)
Sodium: 137 mmol/L (ref 135–145)
Total Bilirubin: 0.4 mg/dL (ref 0.3–1.2)
Total Protein: 6.6 g/dL (ref 6.5–8.1)

## 2021-02-25 LAB — TROPONIN I (HIGH SENSITIVITY)
Troponin I (High Sensitivity): 12 ng/L (ref <18)
Troponin I (High Sensitivity): 11 ng/L

## 2021-02-25 NOTE — ED Notes (Signed)
Pt left AMA °

## 2021-02-25 NOTE — ED Provider Notes (Signed)
Emergency Medicine Provider Triage Evaluation Note  Johnny Casey , a 70 y.o. male  was evaluated in triage.  Pt complains of multiple syncopal episodes. Has had 2 episodes today. Reports just prior to passing out he has weakness and dizziness. Denies new cp/sob. Denies infectious sxs. Denies injuries from the falls. Reports head trauma. In ccollar, had cervical spine surgery 9/28.   Review of Systems  Positive: Syncope, head injury Negative: Fevers, nvd  Physical Exam  BP 93/71 (BP Location: Left Arm)   Pulse 97   Temp 97.8 F (36.6 C) (Oral)   Resp 16   SpO2 100%  Gen:   Awake, no distress   Resp:  Normal effort  MSK:   Moves extremities without difficulty  Other:  Clear speech, oriented, rue weakness compared to right which is baseline, ble weakness which is chronic and unchanged  Medical Decision Making  Medically screening exam initiated at 1:05 PM.  Appropriate orders placed.  Maryln Gottron was informed that the remainder of the evaluation will be completed by another provider, this initial triage assessment does not replace that evaluation, and the importance of remaining in the ED until their evaluation is complete.     Bishop Dublin 02/25/21 1310    Daleen Bo, MD 02/25/21 2037

## 2021-02-25 NOTE — ED Triage Notes (Signed)
Patient arrives with wife who states that the patient has fell twice today. Patient fainted/fell twice today and was unable to recall what happened. Patient is alert and oriented x4 in triage, but states the feels "foggy" in the head.

## 2023-05-11 IMAGING — CT CT HEAD W/O CM
3 series · 15 of 47 positions shown, 18 images · non-contrast
Comparison: None.

CLINICAL DATA: Fainted/fell twice today, amnesia

EXAM:
CT HEAD WITHOUT CONTRAST
TECHNIQUE: Contiguous axial images were obtained from the base of the skull
through the vertex without intravenous contrast.

[Series 3: head 5.0 h30s · axial · 0.48mm/px · z∈[-107,+53]mm · 9 of 38 slices shown, 12 images]
[im 3/38  brain]
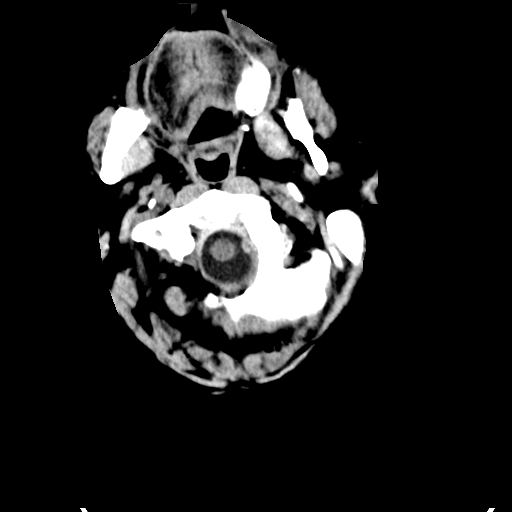
[im 3/38  bone]
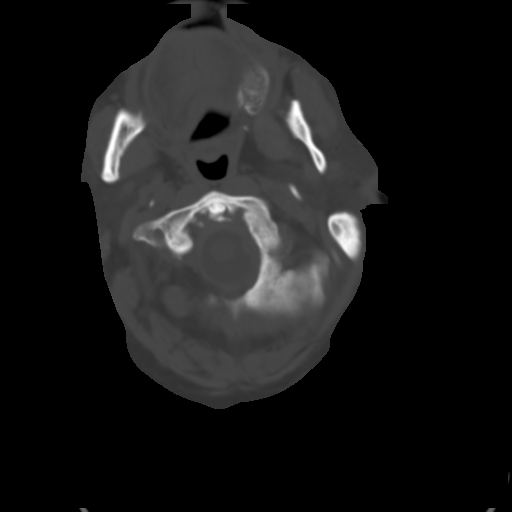
[im 7/38  brain]
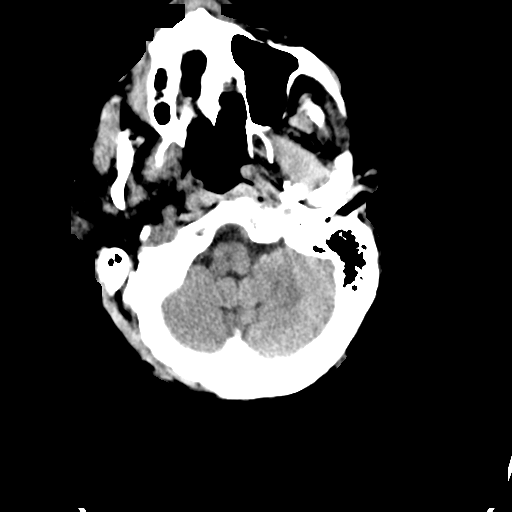
[im 11/38  brain]
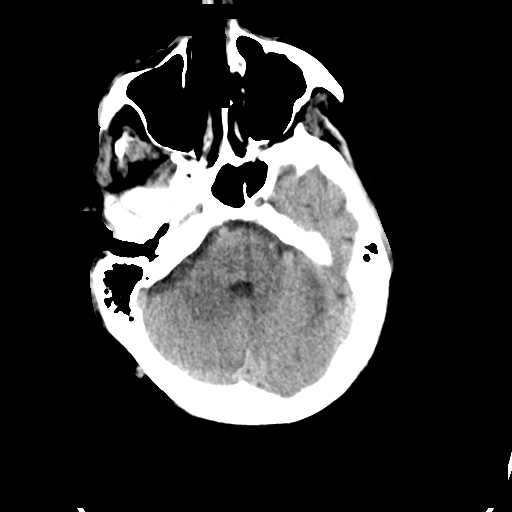
[im 15/38  brain]
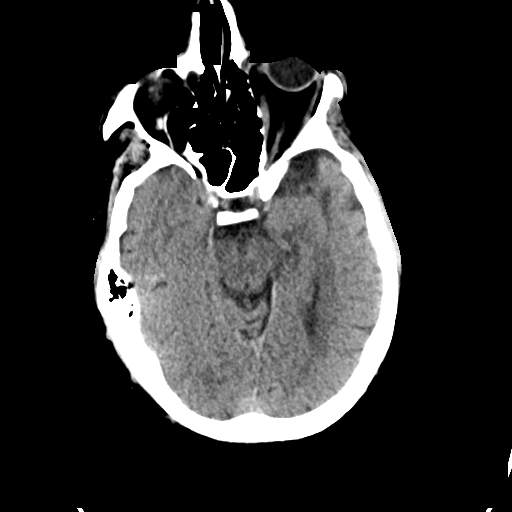
[im 20/38  brain]
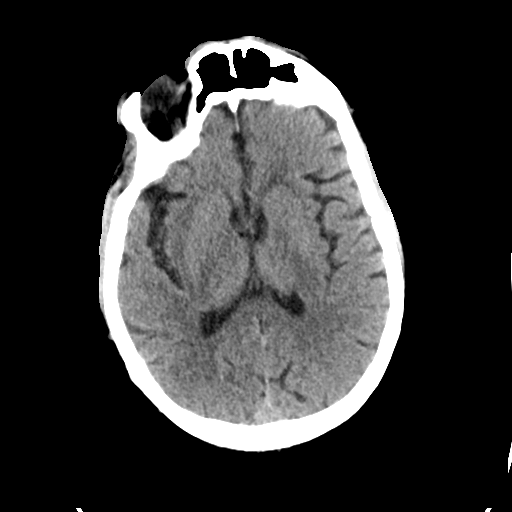
[im 20/38  bone]
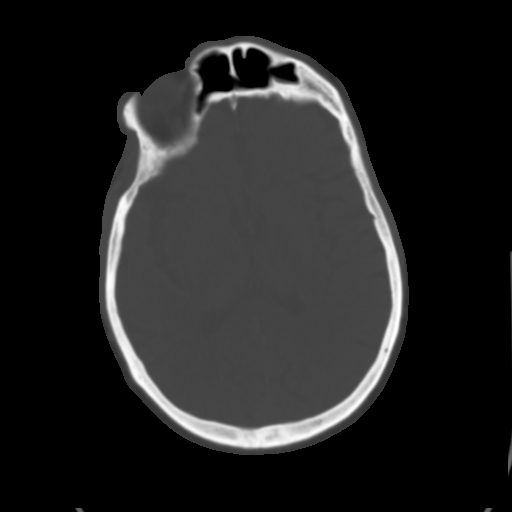
[im 23/38  brain]
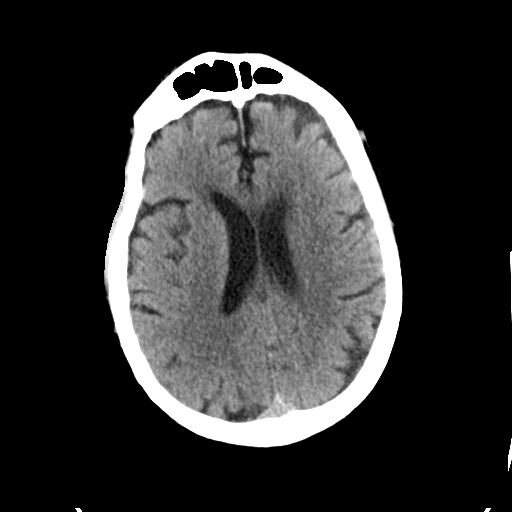
[im 27/38  brain]
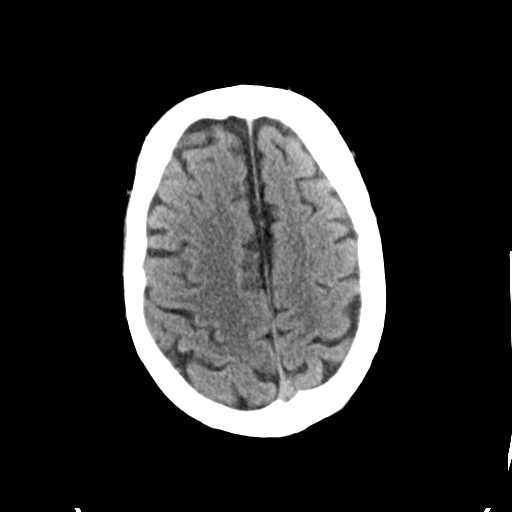
[im 31/38  brain]
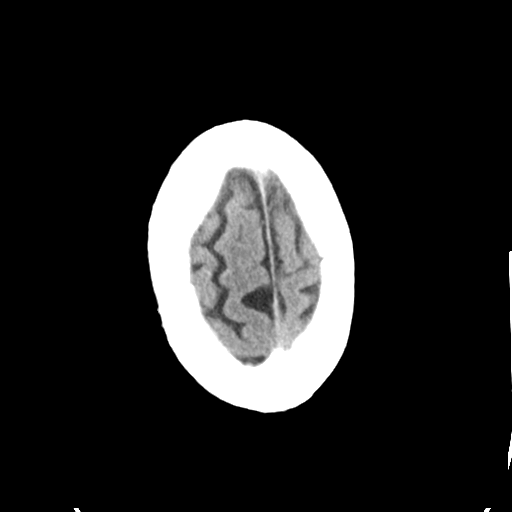
[im 35/38  brain]
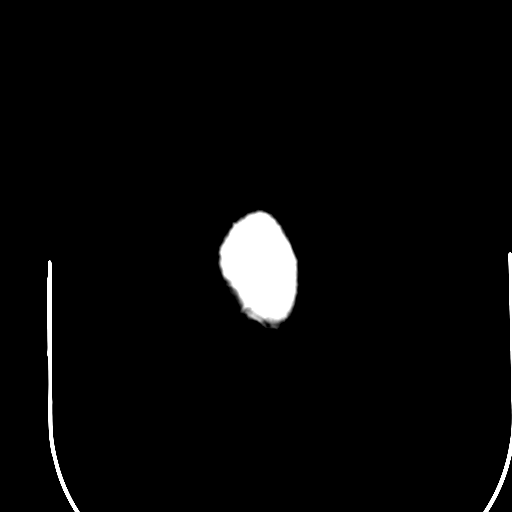
[im 35/38  bone]
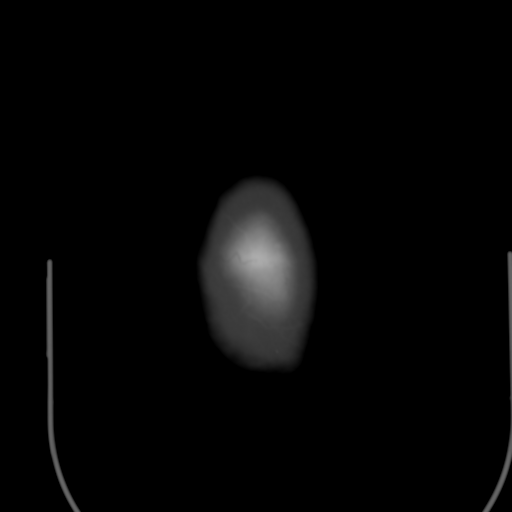

[Series 5: head 3.0 mpr cor · coronal · 0.36mm/px · 3 of 75 slices shown]
[im 25/75  brain]
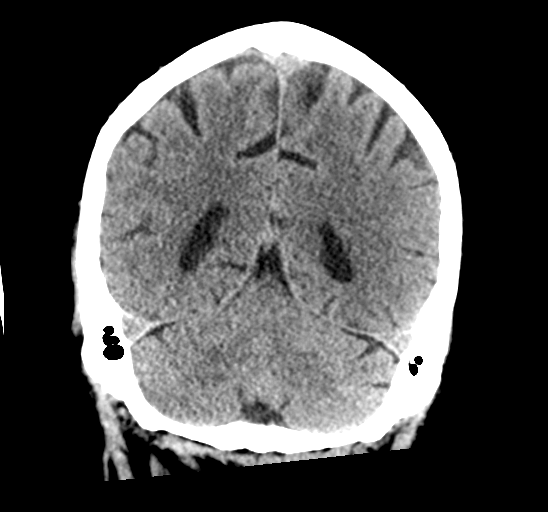
[im 33/75  brain]
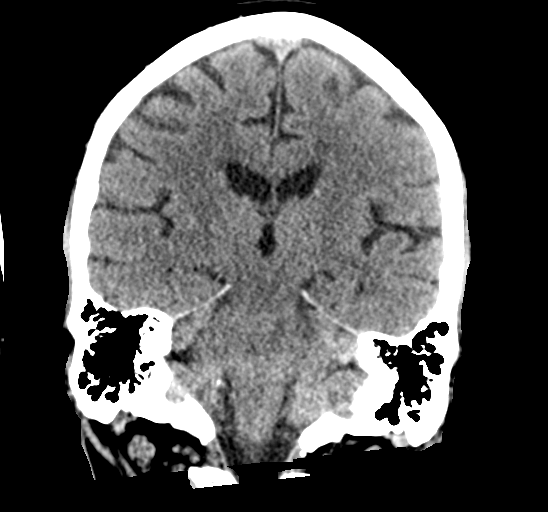
[im 42/75  brain]
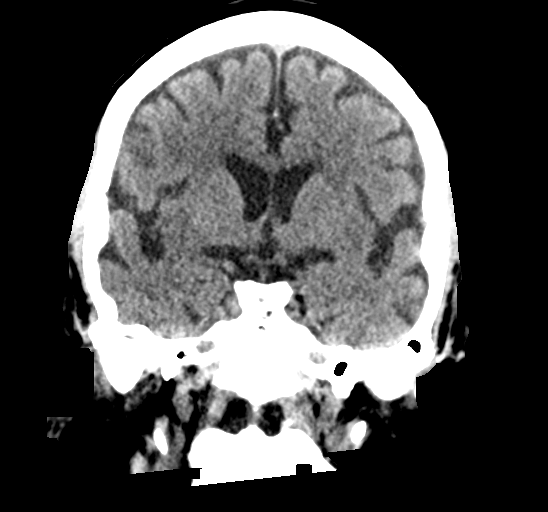

[Series 6: head 3.0 mpr sag · sagittal · 0.36mm/px · 3 of 67 slices shown]
[im 25/67  brain]
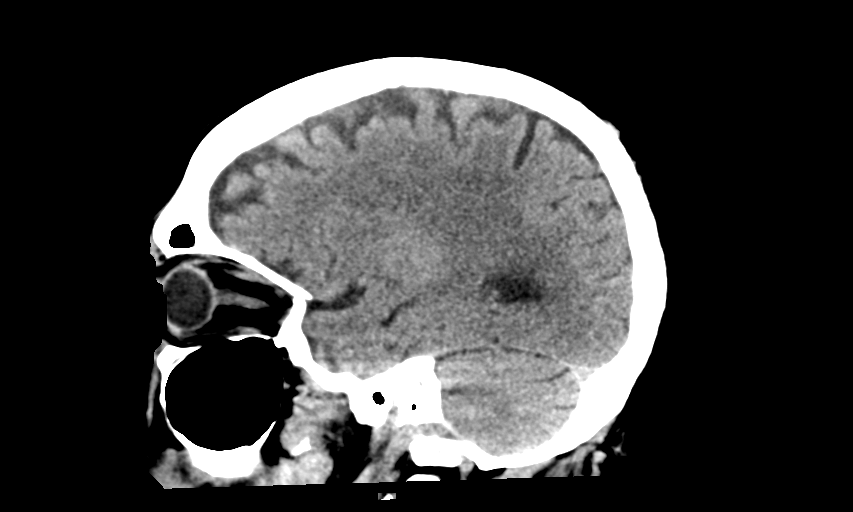
[im 34/67  brain]
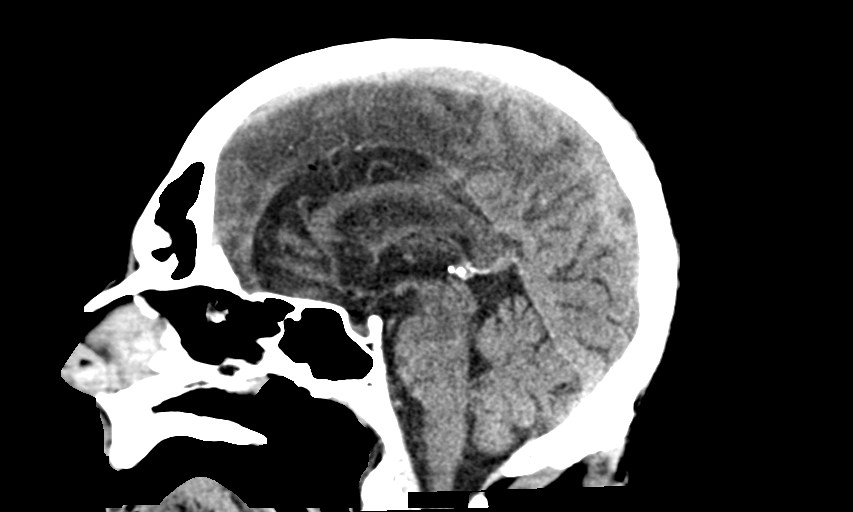
[im 42/67  brain]
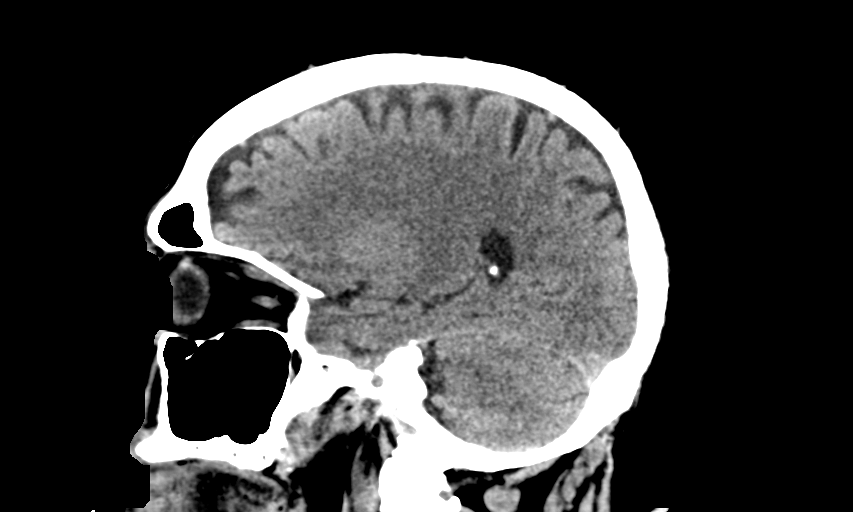

[15 of 47 positions shown; findings below may reference images not displayed]

FINDINGS: Brain: There is no acute intracranial hemorrhage, extra-axial fluid
collection, or acute infarct.

Parenchymal volume is normal.  The ventricles are normal in size.

There is no mass lesion.  There is no midline shift.

Vascular: No hyperdense vessel or unexpected calcification.

Skull: Normal. Negative for fracture or focal lesion.

Sinuses/Orbits: The paranasal sinuses are clear. Bilateral lens
implants are in place. The globes and orbits are otherwise
unremarkable.

Other: None.
IMPRESSION: No acute intracranial pathology or calvarial fracture.

## 2023-07-07 ENCOUNTER — Ambulatory Visit (HOSPITAL_BASED_OUTPATIENT_CLINIC_OR_DEPARTMENT_OTHER)
Admission: RE | Admit: 2023-07-07 | Discharge: 2023-07-07 | Disposition: A | Discharge status: Home / Self Care | Payer: No Typology Code available for payment source | Source: Ambulatory Visit

## 2023-07-07 ENCOUNTER — Encounter (HOSPITAL_BASED_OUTPATIENT_CLINIC_OR_DEPARTMENT_OTHER): Payer: Self-pay

## 2023-07-07 VITALS — BP 158/87 | HR 72 | Temp 98.3°F | Resp 20

## 2023-07-07 DIAGNOSIS — H9202 Otalgia, left ear: Secondary | ICD-10-CM | POA: Diagnosis not present

## 2023-07-07 DIAGNOSIS — H6122 Impacted cerumen, left ear: Secondary | ICD-10-CM

## 2023-07-07 NOTE — ED Provider Notes (Signed)
 Johnny [facility] CARE    CSN: [id number] Arrival date & time: 07/07/23  1539      History   Chief Complaint Chief Complaint  Patient presents with   Otalgia    HPI Johnny Casey is a 73 y.o. male.   Patient presents today companied by his wife who help provide the majority of history.  Reports a 6-week history of left ear pain and decreased hearing.  He reports that pain is rated 8 on a 0-10 pain scale, described as pressure with periodic shooting pains, no alleviating factors notified.  He does report that he has a history of seasonal allergies and has been compliant with his cetirizine.  He does have a history of cerumen impaction but has been using Debrox and other earwax removal methodologies at home without improvement.  He occasionally uses Q-tips but does not use earbuds or hearing aids.  Denies any recent swimming.  Denies airplane travel.  He was treated with an antibiotic about 3 months ago for pneumonia that was noted on a lung cancer screening CT but denies additional antibiotics since that time.    Past Medical History:  Diagnosis Date   Anxiety    Arthritis    BPH (benign prostatic hyperplasia)    Cancer (HCC)    melanoma on back   CKD (chronic kidney disease)    COPD (chronic obstructive pulmonary disease) (HCC)    Diabetes mellitus without complication (HCC)    GERD (gastroesophageal reflux disease)    Hepatitis    fatty liver   Hypertension    PUD (peptic ulcer disease)    s/p APC 08/2019   Sleep apnea     Patient Active Problem List   Diagnosis Date Noted   Stenosis of cervical spine with myelopathy (HCC) 02/01/2021    Past Surgical History:  Procedure Laterality Date   ANTERIOR CERVICAL DECOMP/DISCECTOMY FUSION     C6-7 ACDF 07/30/16 per [facility] records   ANTERIOR CERVICAL DECOMP/DISCECTOMY FUSION N/A 02/01/2021   Procedure: Anterior Cervical Decompression Fusion  Cervical three-four, Cervical four-five;  Surgeon: Lisbeth Renshaw, MD;  Location: [facility]  OR;  Service: Neurosurgery;  Laterality: N/A;   APPENDECTOMY     BACK SURGERY     CHOLECYSTECTOMY     EYE SURGERY     bilateral cataracts   TONSILLECTOMY         Home Medications    Prior to Admission medications   Medication Sig Start Date End Date Taking? Authorizing Provider  atorvastatin (LIPITOR) 20 MG tablet Take 60 mg by mouth daily.   Yes [provider]  clopidogrel (PLAVIX) 75 MG tablet Take 75 mg by mouth daily.   Yes [provider]  tiotropium (SPIRIVA) 18 MCG inhalation capsule Place 18 mcg into inhaler and inhale daily.   Yes [provider]  vitamin B-12 (CYANOCOBALAMIN) 100 MCG tablet Take 100 mcg by mouth daily.   Yes [provider]  albuterol (VENTOLIN HFA) 108 (90 Base) MCG/ACT inhaler Inhale 2 puffs into the lungs every 6 (six) hours as needed for wheezing or shortness of breath.    [provider]  aspirin EC 81 MG tablet Take 81 mg by mouth daily. Swallow whole.    [provider]  busPIRone (BUSPAR) 15 MG tablet Take 15 mg by mouth 2 (two) times daily. 12/06/19   [provider]  Calcium Carb-Cholecalciferol 600-400 MG-UNIT TABS Take 1 tablet by mouth in the morning and at bedtime. 07/21/19   [provider]  cetirizine (ZYRTEC) 10 MG tablet Take 10 mg by mouth in the morning. 09/22/19   [provider]  Cholecalciferol 25 MCG (1000 UT) tablet Take 1,000 Units by mouth daily. 03/22/19   [provider]  DULoxetine (CYMBALTA) 60 MG capsule Take 60 mg by mouth 2 (two) times daily. 10/17/19   [provider]  famotidine (PEPCID) 20 MG tablet Take 20 mg by mouth at bedtime. 09/22/19   [provider]  insulin regular (NOVOLIN R) 100 units/mL injection Inject 10 Units into the skin 2 (two) times daily as needed (blood sugar above 150). 04/14/19   [provider]  lidocaine (LIDODERM) 5 % Place 1 patch onto the skin daily as needed (pain). Remove & Discard  patch within 12 hours or as directed by MD    [provider]  linaclotide (LINZESS) 145 MCG CAPS capsule Take 145 mcg by mouth daily before breakfast.    [provider]  Magnesium Oxide 420 MG TABS Take 420 mg by mouth in the morning and at bedtime. 07/21/19   [provider]  methocarbamol (ROBAXIN) 500 MG tablet Take 1 tablet (500 mg total) by mouth every 6 (six) hours as needed for muscle spasms. 02/09/21   Julio Sicks, MD  metoprolol tartrate (LOPRESSOR) 25 MG tablet Take 12.5 mg by mouth at bedtime. 07/21/19   [provider]  Omega-3 Fatty Acids (FISH OIL) 500 MG CAPS Take 1,000 mg by mouth in the morning and at bedtime.    [provider]  Oxcarbazepine (TRILEPTAL) 300 MG tablet Take 300 mg by mouth in the morning and at bedtime. 12/08/19   [provider]  oxyCODONE (OXY IR/ROXICODONE) 5 MG immediate release tablet Take 1 tablet (5 mg total) by mouth every 3 (three) hours as needed for moderate pain ((score 4 to 6)). Patient taking differently: Take 10 mg by mouth every 3 (three) hours as needed for moderate pain (pain score 4-6) ((score 4 to 6)). 02/09/21   Julio Sicks, MD  pantoprazole (PROTONIX) 40 MG tablet Take 40 mg by mouth in the morning. 09/02/20   [provider]  pravastatin (PRAVACHOL) 10 MG tablet Take 10 mg by mouth daily.    [provider]  ranolazine (RANEXA) 500 MG 12 hr tablet Take 500 mg by mouth in the morning and at bedtime. 12/07/19   [provider]  rOPINIRole (REQUIP) 0.25 MG tablet Take 0.25 mg by mouth at bedtime. 12/15/19   [provider]  tamsulosin (FLOMAX) 0.4 MG CAPS capsule Take 0.8 mg by mouth at bedtime.    [provider]    Family History History reviewed. No pertinent family history.  Social History Social History   Tobacco Use   Smoking status: Every Day    Current packs/day: 0.50    Types: Cigarettes   Smokeless tobacco: Former  Substance Use Topics    Alcohol use: Not Currently     Allergies   Lactulose   Review of Systems Review of Systems  Constitutional:  Positive for activity change. Negative for appetite change, fatigue and fever.  HENT:  Positive for ear pain and hearing loss. Negative for congestion, ear discharge, sinus pressure, sneezing and sore throat.   Respiratory:  Negative for cough and shortness of breath.   Cardiovascular:  Negative for chest pain.  Gastrointestinal:  Negative for abdominal pain, diarrhea, nausea and vomiting.  Neurological:  Negative for dizziness, light-headedness and headaches.     Physical Exam Triage Vital Signs ED Triage  Vitals  Encounter Vitals Group     BP 07/07/23 1613 (!) 158/87     Systolic BP Percentile --      Diastolic BP Percentile --      Pulse Rate 07/07/23 1613 72     Resp 07/07/23 1613 20     Temp 07/07/23 1613 98.3 F (36.8 C)     Temp Source 07/07/23 1613 Oral     SpO2 07/07/23 1613 95 %     Weight --      Height --      Head Circumference --      Peak Flow --      Pain Score 07/07/23 1615 8     Pain Loc --      Pain Education --      Exclude from Growth Chart --    No data found.  Updated Vital Signs BP (!) 158/87 (BP Location: Left Arm)   Pulse 72   Temp 98.3 F (36.8 C) (Oral)   Resp 20   SpO2 95%   Visual Acuity Right Eye Distance:   Left Eye Distance:   Bilateral Distance:    Right Eye Near:   Left Eye Near:    Bilateral Near:     Physical Exam Vitals reviewed.  Constitutional:      General: He is awake.     Appearance: Normal appearance. He is well-developed. He is not ill-appearing.     Comments: Very pleasant male appears stated age in no acute distress sitting comfortably in his motorized wheelchair  HENT:     Head: Normocephalic and atraumatic.     Right Ear: Tympanic membrane, ear canal and external ear normal. There is no impacted cerumen. Tympanic membrane is not erythematous or bulging.     Left Ear: Ear canal and external  ear normal. There is impacted cerumen. Tympanic membrane is not erythematous or bulging.     Ears:     Comments: Left ear: Cerumen impaction noted on exam.  This resolved following in office irrigation revealing normal TM.    Nose: Nose normal.     Mouth/Throat:     Pharynx: Uvula midline. Posterior oropharyngeal erythema present. No oropharyngeal exudate, uvula swelling or postnasal drip.  Cardiovascular:     Rate and Rhythm: Normal rate and regular rhythm.     Heart sounds: Normal heart sounds, S1 normal and S2 normal. No murmur heard. Pulmonary:     Effort: Pulmonary effort is normal. No accessory muscle usage or respiratory distress.     Breath sounds: Normal breath sounds. No stridor. No wheezing, rhonchi or rales.     Comments: Clear to auscultation bilaterally Neurological:     Mental Status: He is alert.  Psychiatric:        Behavior: Behavior is cooperative.      UC Treatments / Results  Labs (all labs ordered are listed, but only abnormal results are displayed) Labs Reviewed - No data to display  EKG   Radiology No results found.  Procedures Ear Cerumen Removal  Date/Time: 07/07/2023 4:42 PM  Performed by: Jeani Hawking, PA-C Authorized by: Jeani Hawking, PA-C   Consent:    Consent obtained:  Verbal   Consent given by:  Patient   Risks discussed:  Infection, incomplete removal, pain and dizziness   Alternatives discussed:  Alternative treatment and observation Universal protocol:    Patient identity confirmed:  Verbally with patient Procedure details:    Location:  L ear   Procedure type:  irrigation     Procedure outcomes: cerumen removed   Post-procedure details:    Inspection:  Ear canal clear and TM intact   Hearing quality:  Improved   Procedure completion:  Tolerated  (including critical care time)  Medications Ordered in UC Medications - No data to display  Initial Impression / Assessment and Plan / UC Course  I have reviewed the triage  vital signs and the nursing notes.  Pertinent labs & imaging results that were available during my care of the patient were reviewed by me and considered in my medical decision making (see chart for details).     Patient is well-appearing, afebrile, nontoxic, nontachycardic.  Cerumen impaction was noted on exam and successfully removed in clinic with improvement of symptoms.  No evidence of acute infection that would warrant initiation of antibiotics on exam.  Recommended to use over-the-counter medication such as Debrox to help prevent cerumen impaction.  He can use Tylenol and other analgesics for pain relief.  Discussed that if anything worsens or changes and he has increasing pain, otorrhea, nausea, vomiting, fever he should be seen for reevaluation.  Strict return precautions given.  All questions answered to patient and caregiver satisfaction.  Final Clinical Impressions(s) / UC Diagnoses   Final diagnoses:  Impacted cerumen of left ear  Acute otalgia, left  Hearing loss of left ear due to cerumen impaction     Discharge Instructions      We were able to remove the wax which I think was causing your symptoms.  Use Tylenol and/or pain medication to help with the discomfort.  If you have any increasing pain, swelling, drainage from the ear, fever, nausea, vomiting you should return for reevaluation.  Follow-up with your primary care for recheck.     ED Prescriptions   None    PDMP not reviewed this encounter.   Jeani Hawking, PA-C 07/07/23 1642

## 2023-07-07 NOTE — Discharge Instructions (Signed)
 We were able to remove the wax which I think was causing your symptoms.  Use Tylenol and/or pain medication to help with the discomfort.  If you have any increasing pain, swelling, drainage from the ear, fever, nausea, vomiting you should return for reevaluation.  Follow-up with your primary care for recheck.

## 2023-07-07 NOTE — ED Triage Notes (Signed)
 Bilateral ear pain x 2 1/2 months.  States he doesn't believe it is wax build up because he removes his ear wax. No fever. Hx of sinus problems. Has not been seen by a physician to evaluate this. Patient is a chronic pain patient.

## 2023-08-22 ENCOUNTER — Ambulatory Visit (HOSPITAL_BASED_OUTPATIENT_CLINIC_OR_DEPARTMENT_OTHER)
Admit: 2023-08-22 | Discharge: 2023-08-22 | Disposition: A | Discharge status: Home / Self Care | Attending: Family Medicine | Admitting: Family Medicine

## 2023-08-22 ENCOUNTER — Ambulatory Visit (HOSPITAL_BASED_OUTPATIENT_CLINIC_OR_DEPARTMENT_OTHER)
Admission: RE | Admit: 2023-08-22 | Discharge: 2023-08-22 | Disposition: A | Discharge status: Home / Self Care | Source: Ambulatory Visit | Attending: Family Medicine | Admitting: Family Medicine

## 2023-08-22 ENCOUNTER — Encounter (HOSPITAL_BASED_OUTPATIENT_CLINIC_OR_DEPARTMENT_OTHER): Payer: Self-pay

## 2023-08-22 VITALS — BP 100/58 | HR 83 | Temp 98.6°F | Resp 20

## 2023-08-22 DIAGNOSIS — M25512 Pain in left shoulder: Secondary | ICD-10-CM

## 2023-08-22 DIAGNOSIS — G992 Myelopathy in diseases classified elsewhere: Secondary | ICD-10-CM | POA: Diagnosis not present

## 2023-08-22 DIAGNOSIS — M4802 Spinal stenosis, cervical region: Secondary | ICD-10-CM

## 2023-08-22 MED ORDER — TRIAMCINOLONE ACETONIDE 40 MG/ML IJ SUSP
40.0000 mg | Freq: Once | INTRAMUSCULAR | Status: AC
  Administered 2023-08-22: 40 mg via INTRAMUSCULAR

## 2023-08-22 NOTE — ED Triage Notes (Signed)
 Onset yesterday of pain radiating down left arm. No trauma. Has hx of degenerative disc disease. States his usual oxycodone not helping.  Has a pain management clinic he is associated with. States he is hoping for answers today.

## 2023-08-22 NOTE — Discharge Instructions (Addendum)
 Nothing concerning on your x-rays.  Everything looks stable.  I believe this is a muscular pain that you are having.  Recommend heat to the area and muscle relaxers.  You can take your pain medicine as prescribed.   Steroid injection given here today  Gentle massage may help. Follow-up as needed

## 2023-08-22 NOTE — ED Provider Notes (Signed)
 Johnny Casey CARE    CSN: [id number] Arrival date & time: 08/22/23  0955      History   Chief Complaint Chief Complaint  Patient presents with   Shoulder Pain    Intense pain in left shoulder blade and neck. 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 - Entered by patient    HPI Johnny Casey is a 73 y.o. male.   Patient is a 73 year old male with past medical history of cervical spine stenosis history of cervical discectomy.  He presents today with sudden onset of pain in the left upper neck/shoulder area that started upon waking up.  Pain does radiate down the left arm.  He does not have any numbness or tingling at this point.  Has known rotator cuff issues on the right side with some tingling and numbness.  This is chronic.  He takes oxycodone for chronic pain which did not help the pain.  He did take a muscle relaxer which seemed to help some.  He has limited range of motion in the left arm.  Denies any falls or injuries   Shoulder Pain   Past Medical History:  Diagnosis Date   Anxiety    Arthritis    BPH (benign prostatic hyperplasia)    Cancer (HCC)    melanoma on back   CKD (chronic kidney disease)    COPD (chronic obstructive pulmonary disease) (HCC)    Diabetes mellitus without complication (HCC)    GERD (gastroesophageal reflux disease)    Hepatitis    fatty liver   Hypertension    PUD (peptic ulcer disease)    s/p APC 08/2019   Sleep apnea     Patient Active Problem List   Diagnosis Date Noted   Stenosis of cervical spine with myelopathy (HCC) 02/01/2021    Past Surgical History:  Procedure Laterality Date   ANTERIOR CERVICAL DECOMP/DISCECTOMY FUSION     C6-7 ACDF 07/30/16 per [facility] records   ANTERIOR CERVICAL DECOMP/DISCECTOMY FUSION N/A 02/01/2021   Procedure: Anterior Cervical Decompression Fusion  Cervical three-four, Cervical four-five;  Surgeon: Augusto Blonder, MD;  Location: [facility] OR;  Service: Neurosurgery;  Laterality: N/A;    APPENDECTOMY     BACK SURGERY     CHOLECYSTECTOMY     EYE SURGERY     bilateral cataracts   TONSILLECTOMY         Home Medications    Prior to Admission medications   Medication Sig Start Date End Date Taking? Authorizing Provider  albuterol (VENTOLIN HFA) 108 (90 Base) MCG/ACT inhaler Inhale 2 puffs into the lungs every 6 (six) hours as needed for wheezing or shortness of breath.    [provider]  aspirin EC 81 MG tablet Take 81 mg by mouth daily. Swallow whole.    [provider]  atorvastatin (LIPITOR) 20 MG tablet Take 60 mg by mouth daily.    [provider]  busPIRone (BUSPAR) 15 MG tablet Take 15 mg by mouth 2 (two) times daily. 12/06/19   [provider]  Calcium Carb-Cholecalciferol 600-400 MG-UNIT TABS Take 1 tablet by mouth in the morning and at bedtime. 07/21/19   [provider]  cetirizine (ZYRTEC) 10 MG tablet Take 10 mg by mouth in the morning. 09/22/19   [provider]  Cholecalciferol 25 MCG (1000 UT) tablet Take 1,000 Units by mouth daily. 03/22/19   [provider]  clopidogrel (PLAVIX) 75 MG tablet Take 75 mg by mouth daily.    [provider]  DULoxetine (CYMBALTA) 60 MG capsule Take 60 mg by mouth 2 (two) times daily. 10/17/19   [provider]  famotidine (PEPCID) 20 MG tablet Take 20 mg by mouth at bedtime. 09/22/19   [provider]  insulin regular (NOVOLIN R) 100 units/mL injection Inject 10 Units into the skin 2 (two) times daily as needed (blood sugar above 150). 04/14/19   [provider]  lidocaine (LIDODERM) 5 % Place 1 patch onto the skin daily as needed (pain). Remove & Discard patch within 12 hours or as directed by MD    [provider]  linaclotide (LINZESS) 145 MCG CAPS capsule Take 145 mcg by mouth daily before breakfast.    [provider]  Magnesium Oxide 420 MG TABS Take 420 mg by mouth in the morning and at bedtime. 07/21/19    [provider]  methocarbamol (ROBAXIN) 500 MG tablet Take 1 tablet (500 mg total) by mouth every 6 (six) hours as needed for muscle spasms. 02/09/21   Johnny Aldrich, MD  metoprolol tartrate (LOPRESSOR) 25 MG tablet Take 12.5 mg by mouth at bedtime. 07/21/19   [provider]  Omega-3 Fatty Acids (FISH OIL) 500 MG CAPS Take 1,000 mg by mouth in the morning and at bedtime.    [provider]  Oxcarbazepine (TRILEPTAL) 300 MG tablet Take 300 mg by mouth in the morning and at bedtime. 12/08/19   [provider]  oxyCODONE (OXY IR/ROXICODONE) 5 MG immediate release tablet Take 1 tablet (5 mg total) by mouth every 3 (three) hours as needed for moderate pain ((score 4 to 6)). Patient taking differently: Take 10 mg by mouth every 3 (three) hours as needed for moderate pain (pain score 4-6) ((score 4 to 6)). 02/09/21   Johnny Aldrich, MD  pantoprazole (PROTONIX) 40 MG tablet Take 40 mg by mouth in the morning. 09/02/20   [provider]  pravastatin (PRAVACHOL) 10 MG tablet Take 10 mg by mouth daily.    [provider]  ranolazine (RANEXA) 500 MG 12 hr tablet Take 500 mg by mouth in the morning and at bedtime. 12/07/19   [provider]  rOPINIRole (REQUIP) 0.25 MG tablet Take 0.25 mg by mouth at bedtime. 12/15/19   [provider]  tamsulosin (FLOMAX) 0.4 MG CAPS capsule Take 0.8 mg by mouth at bedtime.    [provider]  tiotropium (SPIRIVA) 18 MCG inhalation capsule Place 18 mcg into inhaler and inhale daily.    [provider]  vitamin B-12 (CYANOCOBALAMIN) 100 MCG tablet Take 100 mcg by mouth daily.    [provider]    Family History History reviewed. No pertinent family history.  Social History Social History   Tobacco Use   Smoking status: Every Day    Current packs/day: 0.50    Types: Cigarettes   Smokeless tobacco: Former  Substance Use Topics   Alcohol use: Not Currently     Allergies    Lactulose   Review of Systems Review of Systems  See HPI Physical Exam Triage Vital Signs ED Triage Vitals  Encounter Vitals Group     BP 08/22/23 1010 (!) 100/58     Systolic BP Percentile --      Diastolic BP Percentile --      Pulse Rate 08/22/23 1010 83     Resp 08/22/23 1010 20     Temp 08/22/23 1010 98.6 F (37 C)     Temp Source 08/22/23 1010 Oral     SpO2  08/22/23 1010 96 %     Weight --      Height --      Head Circumference --      Peak Flow --      Pain Score 08/22/23 1013 8     Pain Loc --      Pain Education --      Exclude from Growth Chart --    No data found.  Updated Vital Signs BP (!) 100/58 (BP Location: Right Arm)   Pulse 83   Temp 98.6 F (37 C) (Oral)   Resp 20   SpO2 96%   Visual Acuity Right Eye Distance:   Left Eye Distance:   Bilateral Distance:    Right Eye Near:   Left Eye Near:    Bilateral Near:     Physical Exam Vitals and nursing note reviewed.  Constitutional:      General: He is not in acute distress.    Appearance: Normal appearance.  Pulmonary:     Effort: Pulmonary effort is normal.  Musculoskeletal:       Back:     Comments: TTP trapezius  Limited ROM of the left arm, unable to lift past 90 degrees.  No tenderness to palpation of the shoulder.  Non tender to cervical spine  Skin:    General: Skin is warm and dry.  Neurological:     Mental Status: He is alert.      UC Treatments / Results  Labs (all labs ordered are listed, but only abnormal results are displayed) Labs Reviewed - No data to display  EKG   Radiology DG Shoulder Left Result Date: 08/22/2023 CLINICAL DATA:  Neck pain radiating down left arm. EXAM: LEFT SHOULDER - 2+ VIEW COMPARISON:  None Available. FINDINGS: There is no evidence of fracture or dislocation. There is no evidence of arthropathy or other focal bone abnormality. Soft tissues are unremarkable. IMPRESSION: Negative. Electronically Signed   By: Donnal Fusi M.D.   On:  08/22/2023 11:33   DG Cervical Spine Complete Result Date: 08/22/2023 CLINICAL DATA:  Pain EXAM: CERVICAL SPINE - COMPLETE 4+ VIEW COMPARISON:  02/01/2021 FINDINGS: No fracture or dislocation. No prevertebral soft tissue swelling. Stable appearance of instrumented interbody fusion C3-C5 and C6-7. Anterior endplate spurring O1-3 and C5-6 as before. Sternotomy wires. Missing dentition. IMPRESSION: 1. No acute findings. 2. Stable instrumented interbody fusion C3-C5 and C6-7. Electronically Signed   By: Nicoletta Barrier M.D.   On: 08/22/2023 11:18    Procedures Procedures (including critical care time)  Medications Ordered in UC Medications  triamcinolone acetonide (KENALOG-40) injection 40 mg (40 mg Intramuscular Given 08/22/23 1140)    Initial Impression / Assessment and Plan / UC Course  I have reviewed the triage vital signs and the nursing notes.  Pertinent labs & imaging results that were available during my care of the patient were reviewed by me and considered in my medical decision making (see chart for details).     Cervical stenosis and left shoulder pain-x-ray of the cervical spine and shoulder without any acute findings.  Stable.  Most likely strained muscle and nerve inflammation.  Kenalog injection given here for pain and inflammation.  He can continue with his muscle relaxers that are already prescribed as needed. Recommend heat to the area And follow-up for any continued issues Final Clinical Impressions(s) / UC Diagnoses   Final diagnoses:  Stenosis of cervical spine with myelopathy (HCC)  Pain in joint of left shoulder  Discharge Instructions      Nothing concerning on your x-rays.  Everything looks stable.  I believe this is a muscular pain that you are having.  Recommend heat to the area and muscle relaxers.  You can take your pain medicine as prescribed.   Steroid injection given here today  Gentle massage may help. Follow-up as needed     ED Prescriptions    None    PDMP not reviewed this encounter.   Landa Pine, FNP 08/22/23 1147
